# Patient Record
Sex: Female | Born: 1935 | Race: White | Hispanic: No
Health system: Southern US, Community
[De-identification: ages and names within clinical notes are randomized; demographics above are authoritative.]

---

## 2004-04-28 ENCOUNTER — Ambulatory Visit: Payer: Self-pay | Admitting: Family Medicine

## 2005-06-16 ENCOUNTER — Ambulatory Visit: Payer: Self-pay | Admitting: Internal Medicine

## 2005-08-11 ENCOUNTER — Ambulatory Visit: Payer: Self-pay | Admitting: Internal Medicine

## 2006-08-15 ENCOUNTER — Ambulatory Visit: Payer: Self-pay | Admitting: Internal Medicine

## 2006-08-21 ENCOUNTER — Ambulatory Visit: Payer: Self-pay

## 2006-08-23 ENCOUNTER — Ambulatory Visit: Payer: Self-pay | Admitting: Internal Medicine

## 2006-08-25 ENCOUNTER — Ambulatory Visit: Payer: Self-pay | Admitting: Internal Medicine

## 2006-11-05 ENCOUNTER — Emergency Department: Payer: Self-pay

## 2006-11-15 ENCOUNTER — Ambulatory Visit: Payer: Self-pay | Admitting: Gastroenterology

## 2007-01-09 ENCOUNTER — Ambulatory Visit: Payer: Self-pay | Admitting: Gastroenterology

## 2007-05-08 ENCOUNTER — Ambulatory Visit: Payer: Self-pay | Admitting: Specialist

## 2007-08-28 ENCOUNTER — Ambulatory Visit: Payer: Self-pay | Admitting: Internal Medicine

## 2007-11-15 ENCOUNTER — Ambulatory Visit: Payer: Self-pay | Admitting: Specialist

## 2009-08-05 ENCOUNTER — Ambulatory Visit: Payer: Self-pay | Admitting: Internal Medicine

## 2009-09-01 ENCOUNTER — Ambulatory Visit: Payer: Self-pay | Admitting: Internal Medicine

## 2009-09-01 ENCOUNTER — Ambulatory Visit: Payer: Self-pay | Admitting: Cardiology

## 2009-09-04 ENCOUNTER — Ambulatory Visit: Payer: Self-pay | Admitting: Cardiology

## 2011-02-10 ENCOUNTER — Inpatient Hospital Stay: Payer: Self-pay | Admitting: Internal Medicine

## 2011-12-05 IMAGING — US ABDOMEN ULTRASOUND
1 series · 17 of 25 positions shown · non-contrast
Comparison: none

REASON FOR EXAM: abdominal pain
COMMENTS:

[Series 1: abdomen ultrasound · 17 of 77 slices shown]
[im 1/77]
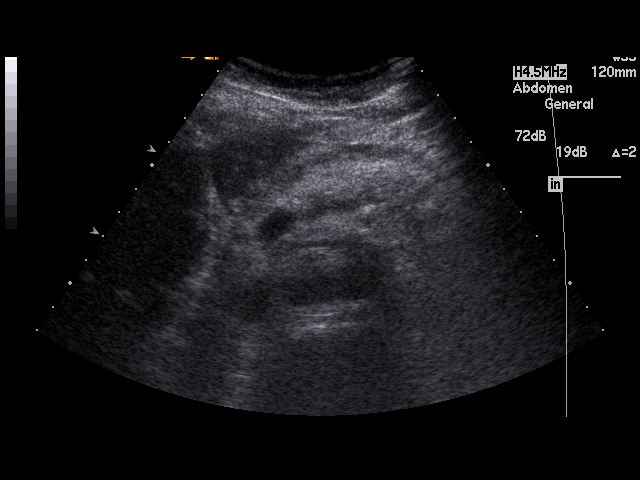
[im 7/77]
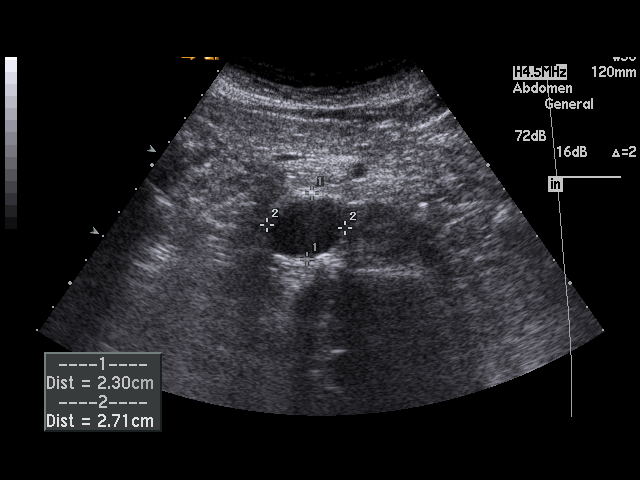
[im 10/77]
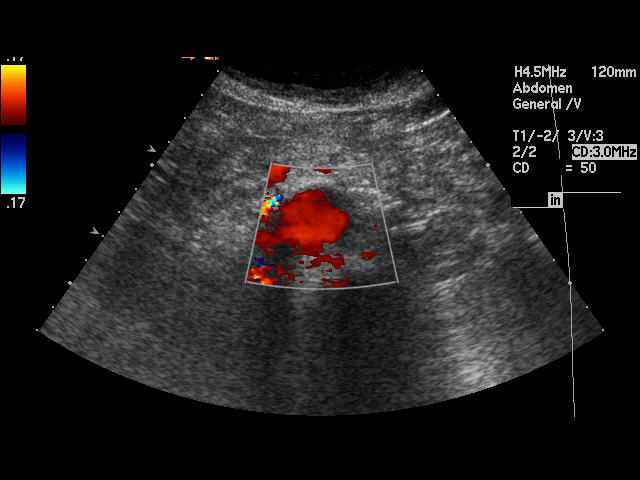
[im 16/77]
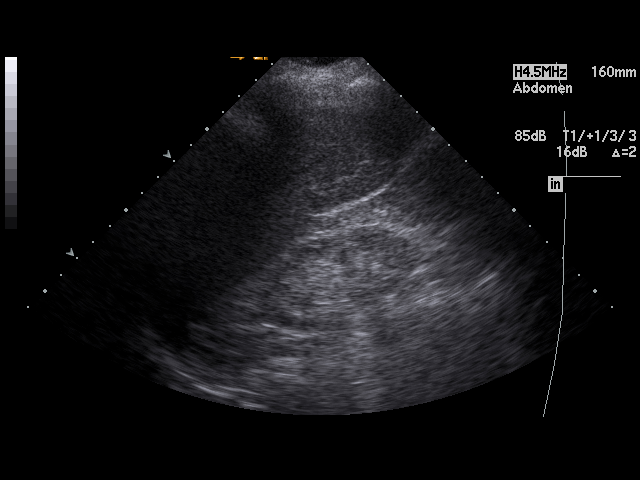
[im 20/77]
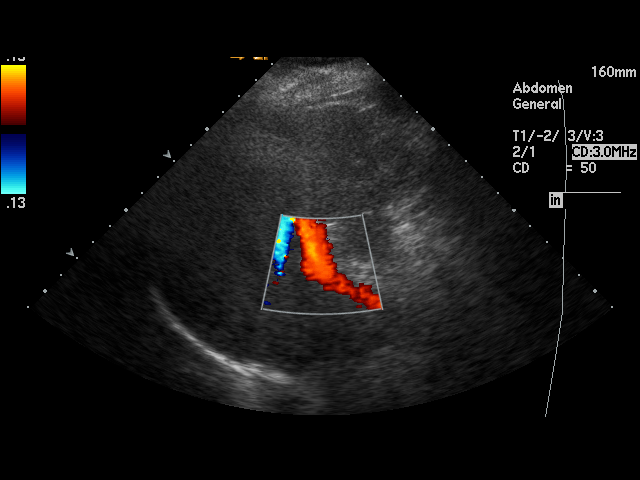
[im 26/77]
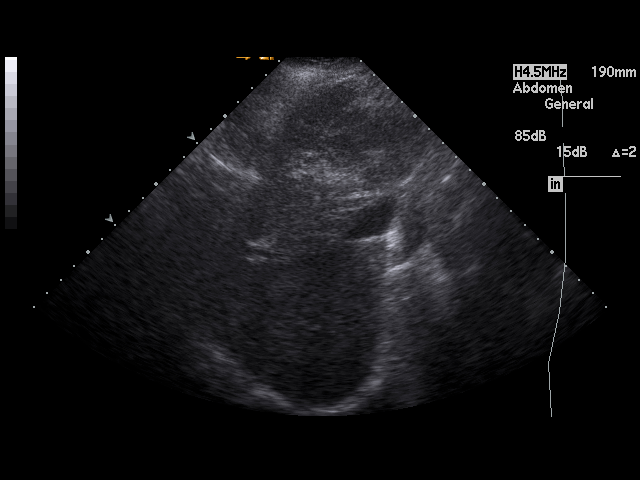
[im 29/77]
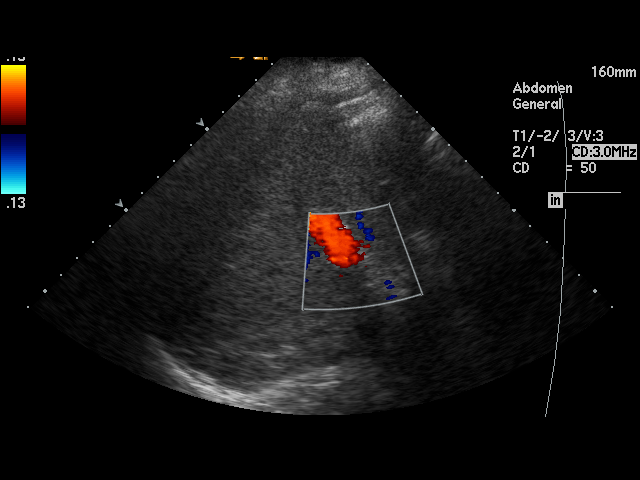
[im 35/77]
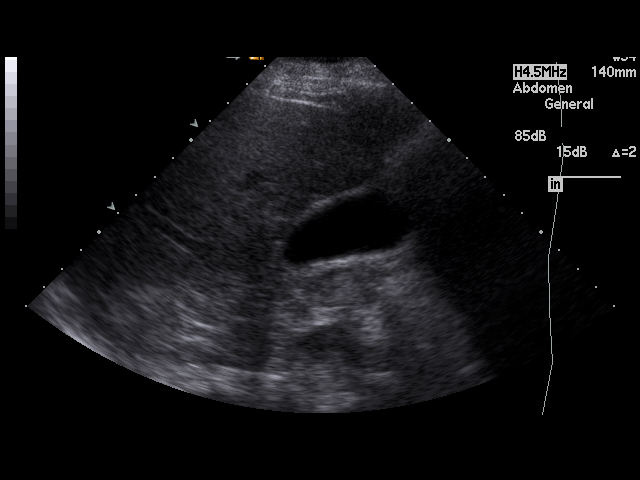
[im 39/77]
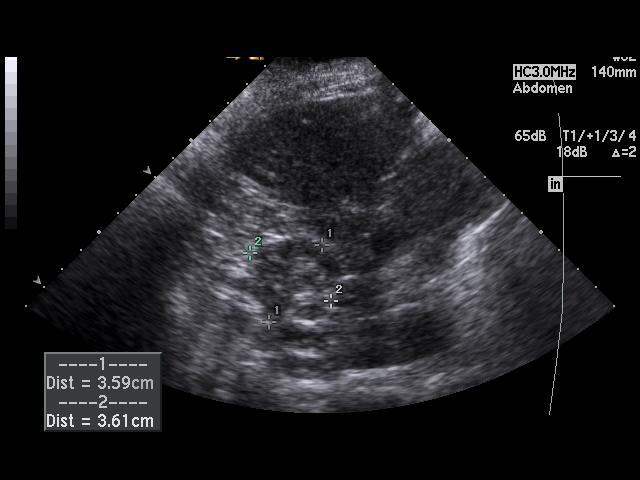
[im 42/77]
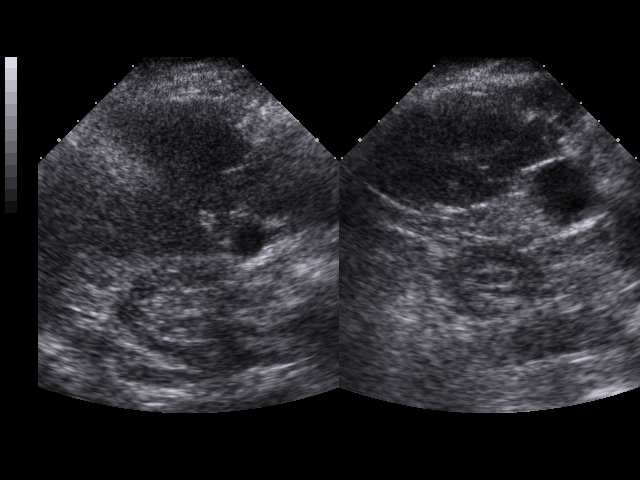
[im 48/77]
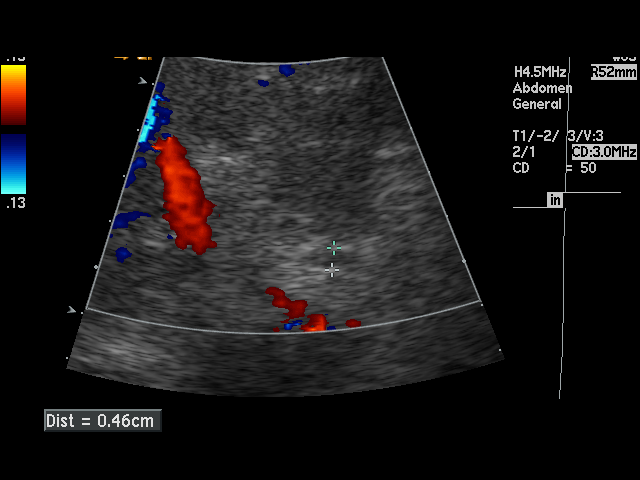
[im 51/77]
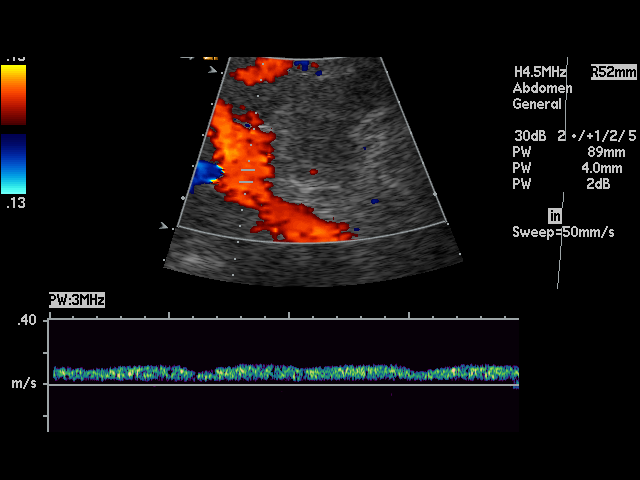
[im 58/77]
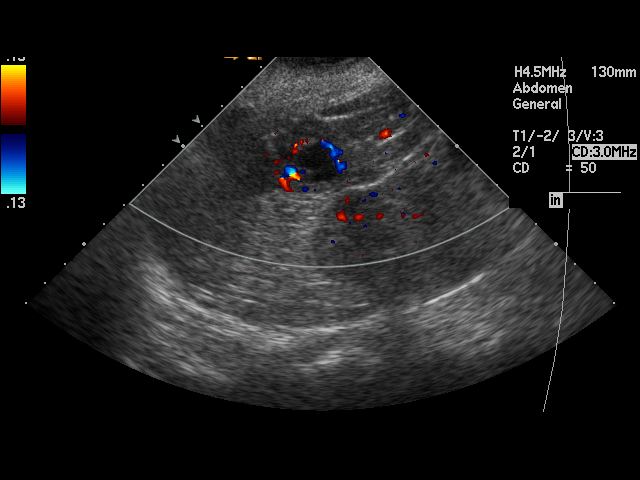
[im 61/77]
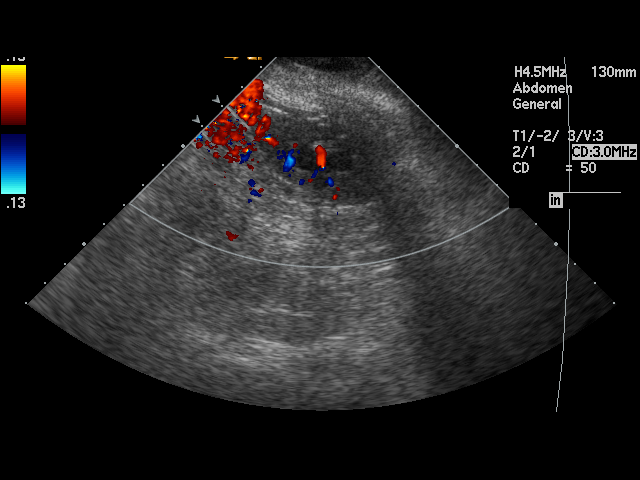
[im 67/77]
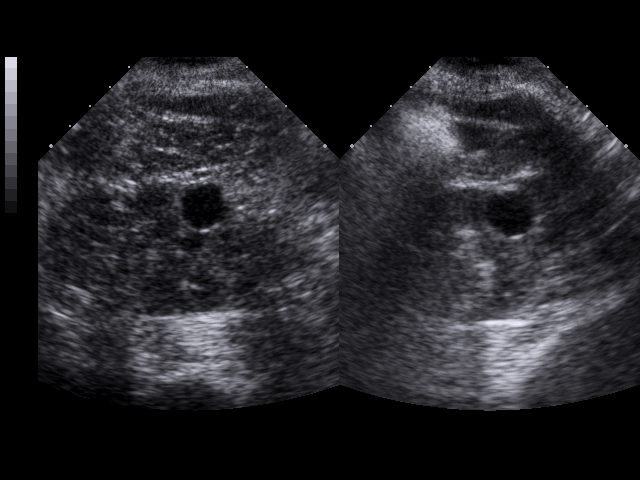
[im 70/77]
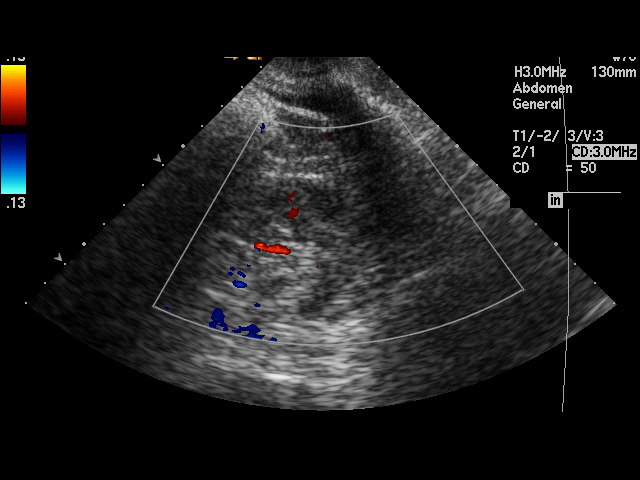
[im 77/77]
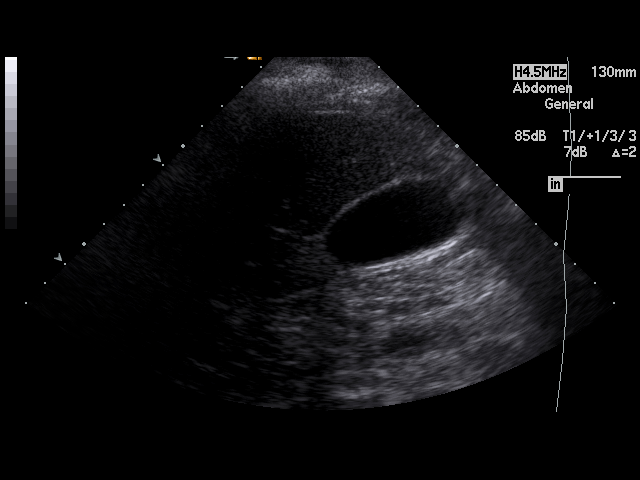

[17 of 25 positions shown; findings below may reference images not displayed]

PROCEDURE:     US  - US ABDOMEN GENERAL SURVEY  - August 05, 2009 [DATE]

RESULT:

The liver demonstrates a homogeneous echotexture. Hepatopetal flow is
identified within the portal vein. The visualized portion of the aorta and
IVC are unremarkable. The pancreas demonstrates a homogeneous echotexture.
Evaluation of the gallbladder demonstrates no evidence of pericholecystic
fluid, gallstones nor sludging. There is no evidence of a sonographic
Murphy's sign. Gallbladder wall thickness is 2.2 mm. The common bile duct
measures 4.6 mm in diameter. Evaluation of the kidneys demonstrates no
evidence of hydronephrosis, masses or calculi. There is cortical thinning
involving the kidneys. There is appropriate corticomedullary
differentiation. The right kidney measures 9.7 x 3.6 x 3.6 cm and the left
9.8 x 4.7 x 4.4 cm. A cyst is identified within the left kidney measuring
1.73 x 1.84 x 1.37 cm. Evaluation of the spleen demonstrates a partially
cystic appearing mass measuring 3.37 x 2.22 x 2.59 cm.
IMPRESSION: 1. Stable appearing cystic mass within the spleen.
2. No further sonographic abnormalities. A stable cyst is identified within
the left kidney.
3. Thinning of the right renal cortex which may represent a component of
medical renal disease.

## 2013-02-12 ENCOUNTER — Other Ambulatory Visit: Payer: Self-pay | Admitting: Family Medicine

## 2013-02-12 LAB — COMPREHENSIVE METABOLIC PANEL
Alkaline Phosphatase: 83 U/L (ref 50–136)
BUN: 19 mg/dL — ABNORMAL HIGH (ref 7–18)
Bilirubin,Total: 0.4 mg/dL (ref 0.2–1.0)
Calcium, Total: 8.9 mg/dL (ref 8.5–10.1)
Chloride: 105 mmol/L (ref 98–107)
Co2: 27 mmol/L (ref 21–32)
EGFR (African American): 58 — ABNORMAL LOW
Glucose: 188 mg/dL — ABNORMAL HIGH (ref 65–99)
SGOT(AST): 20 U/L (ref 15–37)
SGPT (ALT): 17 U/L (ref 12–78)
Total Protein: 6.3 g/dL — ABNORMAL LOW (ref 6.4–8.2)

## 2013-02-12 LAB — PROTIME-INR
INR: 1
Prothrombin Time: 13.3 secs (ref 11.5–14.7)

## 2013-02-12 LAB — CBC WITH DIFFERENTIAL/PLATELET
Basophil #: 0.2 10*3/uL — ABNORMAL HIGH (ref 0.0–0.1)
Basophil %: 1.8 %
Eosinophil #: 0.3 10*3/uL (ref 0.0–0.7)
Eosinophil %: 3.5 %
HCT: 37.3 % (ref 35.0–47.0)
HGB: 12.5 g/dL (ref 12.0–16.0)
MCH: 28.5 pg (ref 26.0–34.0)
MCHC: 33.5 g/dL (ref 32.0–36.0)
Monocyte #: 0.7 x10 3/mm (ref 0.2–0.9)
Neutrophil #: 5.5 10*3/uL (ref 1.4–6.5)
Neutrophil %: 60.2 %
Platelet: 250 10*3/uL (ref 150–440)
RBC: 4.38 10*6/uL (ref 3.80–5.20)

## 2013-02-12 LAB — URINALYSIS, COMPLETE
Bilirubin,UR: NEGATIVE
Blood: NEGATIVE
Glucose,UR: NEGATIVE mg/dL (ref 0–75)
Ketone: NEGATIVE
Nitrite: POSITIVE
Protein: NEGATIVE
Specific Gravity: 1.024 (ref 1.003–1.030)
WBC UR: 13 /HPF (ref 0–5)

## 2013-02-14 LAB — URINE CULTURE

## 2013-04-13 ENCOUNTER — Observation Stay: Payer: Self-pay | Admitting: Internal Medicine

## 2013-04-13 LAB — URINALYSIS, COMPLETE
BILIRUBIN, UR: NEGATIVE
BLOOD: NEGATIVE
GLUCOSE, UR: NEGATIVE mg/dL (ref 0–75)
Ketone: NEGATIVE
Leukocyte Esterase: NEGATIVE
Nitrite: NEGATIVE
Ph: 5 (ref 4.5–8.0)
Protein: NEGATIVE
RBC,UR: <1 /HPF (ref 0–5)
SPECIFIC GRAVITY: 1.009 (ref 1.003–1.030)
Squamous Epithelial: NONE SEEN
WBC UR: 3 /HPF (ref 0–5)

## 2013-04-13 LAB — CBC
HCT: 43.1 % (ref 35.0–47.0)
HGB: 14.3 g/dL (ref 12.0–16.0)
MCH: 27.7 pg (ref 26.0–34.0)
MCHC: 33.1 g/dL (ref 32.0–36.0)
MCV: 84 fL (ref 80–100)
PLATELETS: 255 10*3/uL (ref 150–440)
RBC: 5.16 10*6/uL (ref 3.80–5.20)
RDW: 14.8 % — AB (ref 11.5–14.5)
WBC: 9.2 10*3/uL (ref 3.6–11.0)

## 2013-04-13 LAB — COMPREHENSIVE METABOLIC PANEL
ALK PHOS: 62 U/L
Albumin: 3.1 g/dL — ABNORMAL LOW (ref 3.4–5.0)
Anion Gap: 3 — ABNORMAL LOW (ref 7–16)
BILIRUBIN TOTAL: 0.5 mg/dL (ref 0.2–1.0)
BUN: 18 mg/dL (ref 7–18)
CALCIUM: 9.3 mg/dL (ref 8.5–10.1)
CHLORIDE: 101 mmol/L (ref 98–107)
Co2: 31 mmol/L (ref 21–32)
Creatinine: 0.76 mg/dL (ref 0.60–1.30)
EGFR (African American): >60
EGFR (Non-African Amer.): >60
Glucose: 116 mg/dL — ABNORMAL HIGH (ref 65–99)
Osmolality: 273 (ref 275–301)
Potassium: 5 mmol/L (ref 3.5–5.1)
SGOT(AST): 42 U/L — ABNORMAL HIGH (ref 15–37)
SGPT (ALT): 25 U/L (ref 12–78)
Sodium: 135 mmol/L — ABNORMAL LOW (ref 136–145)
TOTAL PROTEIN: 7.2 g/dL (ref 6.4–8.2)

## 2013-04-13 LAB — CK-MB: CK-MB: 1.2 ng/mL (ref 0.5–3.6)

## 2013-04-13 LAB — TROPONIN I
Troponin-I: <0.02 ng/mL
Troponin-I: <0.02 ng/mL

## 2013-04-14 LAB — CBC WITH DIFFERENTIAL/PLATELET
Basophil #: 0 10*3/uL (ref 0.0–0.1)
Basophil %: 0.5 %
EOS PCT: 4.1 %
Eosinophil #: 0.4 10*3/uL (ref 0.0–0.7)
HCT: 40 % (ref 35.0–47.0)
HGB: 13.5 g/dL (ref 12.0–16.0)
LYMPHS ABS: 2.8 10*3/uL (ref 1.0–3.6)
Lymphocyte %: 32.5 %
MCH: 28.6 pg (ref 26.0–34.0)
MCHC: 33.8 g/dL (ref 32.0–36.0)
MCV: 85 fL (ref 80–100)
Monocyte #: 0.8 x10 3/mm (ref 0.2–0.9)
Monocyte %: 9.6 %
Neutrophil #: 4.5 10*3/uL (ref 1.4–6.5)
Neutrophil %: 53.3 %
Platelet: 231 10*3/uL (ref 150–440)
RBC: 4.73 10*6/uL (ref 3.80–5.20)
RDW: 14.9 % — AB (ref 11.5–14.5)
WBC: 8.5 10*3/uL (ref 3.6–11.0)

## 2013-04-14 LAB — BASIC METABOLIC PANEL
ANION GAP: 1 — AB (ref 7–16)
BUN: 21 mg/dL — AB (ref 7–18)
CREATININE: 0.97 mg/dL (ref 0.60–1.30)
Calcium, Total: 9 mg/dL (ref 8.5–10.1)
Chloride: 104 mmol/L (ref 98–107)
Co2: 32 mmol/L (ref 21–32)
EGFR (Non-African Amer.): 51 — ABNORMAL LOW
GFR CALC AF AMER: 59 — AB
Glucose: 120 mg/dL — ABNORMAL HIGH (ref 65–99)
Osmolality: 278 (ref 275–301)
Potassium: 3.8 mmol/L (ref 3.5–5.1)
SODIUM: 137 mmol/L (ref 136–145)

## 2013-04-14 LAB — CK-MB: CK-MB: 1 ng/mL (ref 0.5–3.6)

## 2013-04-14 LAB — TROPONIN I: Troponin-I: <0.02 ng/mL

## 2013-04-20 ENCOUNTER — Emergency Department: Payer: Self-pay | Admitting: Internal Medicine

## 2013-07-31 ENCOUNTER — Inpatient Hospital Stay: Payer: Self-pay | Admitting: Internal Medicine

## 2013-07-31 LAB — COMPREHENSIVE METABOLIC PANEL
ANION GAP: 7 (ref 7–16)
Albumin: 3 g/dL — ABNORMAL LOW (ref 3.4–5.0)
Alkaline Phosphatase: 73 U/L
BILIRUBIN TOTAL: 0.3 mg/dL (ref 0.2–1.0)
BUN: 16 mg/dL (ref 7–18)
CALCIUM: 9.2 mg/dL (ref 8.5–10.1)
CHLORIDE: 105 mmol/L (ref 98–107)
CREATININE: 0.84 mg/dL (ref 0.60–1.30)
Co2: 27 mmol/L (ref 21–32)
EGFR (Non-African Amer.): >60
Glucose: 127 mg/dL — ABNORMAL HIGH (ref 65–99)
Osmolality: 280 (ref 275–301)
Potassium: 4.1 mmol/L (ref 3.5–5.1)
SGOT(AST): 24 U/L (ref 15–37)
SGPT (ALT): 21 U/L (ref 12–78)
Sodium: 139 mmol/L (ref 136–145)
Total Protein: 7.2 g/dL (ref 6.4–8.2)

## 2013-07-31 LAB — URINALYSIS, COMPLETE
BACTERIA: NONE SEEN
BILIRUBIN, UR: NEGATIVE
Blood: NEGATIVE
Glucose,UR: NEGATIVE mg/dL (ref 0–75)
Ketone: NEGATIVE
Nitrite: NEGATIVE
Ph: 5 (ref 4.5–8.0)
Protein: NEGATIVE
Specific Gravity: 1.023 (ref 1.003–1.030)
WBC UR: 7 /HPF (ref 0–5)

## 2013-07-31 LAB — APTT: ACTIVATED PTT: 29.3 s (ref 23.6–35.9)

## 2013-07-31 LAB — CBC WITH DIFFERENTIAL/PLATELET
Basophil #: 0 10*3/uL (ref 0.0–0.1)
Basophil %: 0.4 %
EOS ABS: 0.6 10*3/uL (ref 0.0–0.7)
Eosinophil %: 5.4 %
HCT: 43.4 % (ref 35.0–47.0)
HGB: 13.6 g/dL (ref 12.0–16.0)
Lymphocyte #: 3.9 10*3/uL — ABNORMAL HIGH (ref 1.0–3.6)
Lymphocyte %: 35.2 %
MCH: 28 pg (ref 26.0–34.0)
MCHC: 31.4 g/dL — ABNORMAL LOW (ref 32.0–36.0)
MCV: 89 fL (ref 80–100)
Monocyte #: 0.9 x10 3/mm (ref 0.2–0.9)
Monocyte %: 7.9 %
Neutrophil #: 5.7 10*3/uL (ref 1.4–6.5)
Neutrophil %: 51.1 %
Platelet: 253 10*3/uL (ref 150–440)
RBC: 4.85 10*6/uL (ref 3.80–5.20)
RDW: 15.3 % — AB (ref 11.5–14.5)
WBC: 11.2 10*3/uL — AB (ref 3.6–11.0)

## 2013-07-31 LAB — TROPONIN I: Troponin-I: <0.02 ng/mL

## 2013-07-31 LAB — PROTIME-INR
INR: 1.1
Prothrombin Time: 13.8 secs (ref 11.5–14.7)

## 2013-08-01 LAB — CBC WITH DIFFERENTIAL/PLATELET
Basophil #: 0.2 10*3/uL — ABNORMAL HIGH (ref 0.0–0.1)
Basophil %: 2.6 %
EOS PCT: 7.5 %
Eosinophil #: 0.6 10*3/uL (ref 0.0–0.7)
HCT: 38.7 % (ref 35.0–47.0)
HGB: 12.9 g/dL (ref 12.0–16.0)
LYMPHS ABS: 3.2 10*3/uL (ref 1.0–3.6)
Lymphocyte %: 37.6 %
MCH: 29.5 pg (ref 26.0–34.0)
MCHC: 33.2 g/dL (ref 32.0–36.0)
MCV: 89 fL (ref 80–100)
MONO ABS: 0.7 x10 3/mm (ref 0.2–0.9)
MONOS PCT: 8.8 %
Neutrophil #: 3.7 10*3/uL (ref 1.4–6.5)
Neutrophil %: 43.5 %
Platelet: 247 10*3/uL (ref 150–440)
RBC: 4.36 10*6/uL (ref 3.80–5.20)
RDW: 15.3 % — ABNORMAL HIGH (ref 11.5–14.5)
WBC: 8.5 10*3/uL (ref 3.6–11.0)

## 2013-08-01 LAB — BASIC METABOLIC PANEL
ANION GAP: 6 — AB (ref 7–16)
BUN: 15 mg/dL (ref 7–18)
CHLORIDE: 108 mmol/L — AB (ref 98–107)
Calcium, Total: 8.5 mg/dL (ref 8.5–10.1)
Co2: 28 mmol/L (ref 21–32)
Creatinine: 0.63 mg/dL (ref 0.60–1.30)
EGFR (African American): >60
Glucose: 97 mg/dL (ref 65–99)
OSMOLALITY: 284 (ref 275–301)
Potassium: 4 mmol/L (ref 3.5–5.1)
SODIUM: 142 mmol/L (ref 136–145)

## 2013-08-01 LAB — LIPID PANEL
Cholesterol: 215 mg/dL — ABNORMAL HIGH (ref 0–200)
HDL Cholesterol: 31 mg/dL — ABNORMAL LOW (ref 40–60)
LDL CHOLESTEROL, CALC: 160 mg/dL — AB (ref 0–100)
Triglycerides: 118 mg/dL (ref 0–200)
VLDL Cholesterol, Calc: 24 mg/dL (ref 5–40)

## 2013-09-02 ENCOUNTER — Ambulatory Visit: Payer: Self-pay | Admitting: Internal Medicine

## 2013-09-09 ENCOUNTER — Inpatient Hospital Stay: Payer: Self-pay | Admitting: Internal Medicine

## 2013-09-09 LAB — URINALYSIS, COMPLETE
Bilirubin,UR: NEGATIVE
Glucose,UR: NEGATIVE mg/dL (ref 0–75)
Ketone: NEGATIVE
Nitrite: NEGATIVE
PH: 5 (ref 4.5–8.0)
Protein: 100
Specific Gravity: 1.014 (ref 1.003–1.030)
Squamous Epithelial: NONE SEEN

## 2013-09-09 LAB — PHOSPHORUS: Phosphorus: 2 mg/dL — ABNORMAL LOW (ref 2.5–4.9)

## 2013-09-09 LAB — CBC WITH DIFFERENTIAL/PLATELET
BASOS ABS: 0.1 10*3/uL (ref 0.0–0.1)
Basophil #: 0 10*3/uL (ref 0.0–0.1)
Basophil %: 0.4 %
Basophil %: 0.1 %
EOS PCT: 0.1 %
Eosinophil #: 0 10*3/uL (ref 0.0–0.7)
Eosinophil #: 0 10*3/uL (ref 0.0–0.7)
Eosinophil %: 0 %
HCT: 42.4 % (ref 35.0–47.0)
HCT: 37.9 % (ref 35.0–47.0)
HGB: 13.6 g/dL (ref 12.0–16.0)
HGB: 11.9 g/dL — AB (ref 12.0–16.0)
LYMPHS ABS: 0.5 10*3/uL — AB (ref 1.0–3.6)
Lymphocyte #: 1.1 10*3/uL (ref 1.0–3.6)
Lymphocyte %: 2 %
Lymphocyte %: 2.8 %
MCH: 27.8 pg (ref 26.0–34.0)
MCH: 27.4 pg (ref 26.0–34.0)
MCHC: 32 g/dL (ref 32.0–36.0)
MCHC: 31.4 g/dL — AB (ref 32.0–36.0)
MCV: 87 fL (ref 80–100)
MCV: 87 fL (ref 80–100)
MONO ABS: 0.1 x10 3/mm — AB (ref 0.2–0.9)
MONOS PCT: 2.9 %
Monocyte #: 1.1 x10 3/mm — ABNORMAL HIGH (ref 0.2–0.9)
Monocyte %: 0.2 %
NEUTROS ABS: 35.1 10*3/uL — AB (ref 1.4–6.5)
NEUTROS PCT: 97.3 %
Neutrophil #: 24.4 10*3/uL — ABNORMAL HIGH (ref 1.4–6.5)
Neutrophil %: 94.2 %
PLATELETS: 213 10*3/uL (ref 150–440)
Platelet: 255 10*3/uL (ref 150–440)
RBC: 4.89 10*6/uL (ref 3.80–5.20)
RBC: 4.34 10*6/uL (ref 3.80–5.20)
RDW: 14.3 % (ref 11.5–14.5)
RDW: 14.5 % (ref 11.5–14.5)
WBC: 25.1 10*3/uL — AB (ref 3.6–11.0)
WBC: 37.3 10*3/uL — AB (ref 3.6–11.0)

## 2013-09-09 LAB — PROTIME-INR
INR: 1.2
Prothrombin Time: 15.4 secs — ABNORMAL HIGH (ref 11.5–14.7)

## 2013-09-09 LAB — COMPREHENSIVE METABOLIC PANEL
ALK PHOS: 69 U/L
ANION GAP: 11 (ref 7–16)
AST: 43 U/L — AB (ref 15–37)
Albumin: 2.5 g/dL — ABNORMAL LOW (ref 3.4–5.0)
BILIRUBIN TOTAL: 0.6 mg/dL (ref 0.2–1.0)
BUN: 19 mg/dL — ABNORMAL HIGH (ref 7–18)
CO2: 22 mmol/L (ref 21–32)
Calcium, Total: 8.9 mg/dL (ref 8.5–10.1)
Chloride: 107 mmol/L (ref 98–107)
Creatinine: 1.6 mg/dL — ABNORMAL HIGH (ref 0.60–1.30)
EGFR (African American): 32 — ABNORMAL LOW
EGFR (Non-African Amer.): 28 — ABNORMAL LOW
GLUCOSE: 152 mg/dL — AB (ref 65–99)
OSMOLALITY: 285 (ref 275–301)
POTASSIUM: 3.7 mmol/L (ref 3.5–5.1)
SGPT (ALT): 24 U/L (ref 12–78)
Sodium: 140 mmol/L (ref 136–145)
Total Protein: 6.3 g/dL — ABNORMAL LOW (ref 6.4–8.2)

## 2013-09-09 LAB — APTT: Activated PTT: 133.4 secs — ABNORMAL HIGH (ref 23.6–35.9)

## 2013-09-09 LAB — TROPONIN I
Troponin-I: 0.17 ng/mL — ABNORMAL HIGH
Troponin-I: 0.38 ng/mL — ABNORMAL HIGH

## 2013-09-09 LAB — CK-MB
CK-MB: 0.8 ng/mL (ref 0.5–3.6)
CK-MB: 2.2 ng/mL (ref 0.5–3.6)

## 2013-09-09 LAB — MAGNESIUM: Magnesium: 1.5 mg/dL — ABNORMAL LOW

## 2013-09-10 LAB — BASIC METABOLIC PANEL
Anion Gap: 9 (ref 7–16)
BUN: 21 mg/dL — AB (ref 7–18)
CALCIUM: 7.7 mg/dL — AB (ref 8.5–10.1)
CREATININE: 1.27 mg/dL (ref 0.60–1.30)
Chloride: 109 mmol/L — ABNORMAL HIGH (ref 98–107)
Co2: 21 mmol/L (ref 21–32)
GFR CALC AF AMER: 42 — AB
GFR CALC NON AF AMER: 37 — AB
Glucose: 119 mg/dL — ABNORMAL HIGH (ref 65–99)
Osmolality: 282 (ref 275–301)
POTASSIUM: 4.1 mmol/L (ref 3.5–5.1)
SODIUM: 139 mmol/L (ref 136–145)

## 2013-09-10 LAB — CBC WITH DIFFERENTIAL/PLATELET
Basophil #: 0.4 10*3/uL — ABNORMAL HIGH (ref 0.0–0.1)
Basophil %: 1.1 %
EOS ABS: 0 10*3/uL (ref 0.0–0.7)
EOS PCT: 0 %
HCT: 36.2 % (ref 35.0–47.0)
HGB: 11.6 g/dL — AB (ref 12.0–16.0)
LYMPHS PCT: 3.2 %
Lymphocyte #: 1.2 10*3/uL (ref 1.0–3.6)
MCH: 27.6 pg (ref 26.0–34.0)
MCHC: 31.9 g/dL — ABNORMAL LOW (ref 32.0–36.0)
MCV: 86 fL (ref 80–100)
Monocyte #: 1.1 x10 3/mm — ABNORMAL HIGH (ref 0.2–0.9)
Monocyte %: 2.8 %
NEUTROS PCT: 92.9 %
Neutrophil #: 34.9 10*3/uL — ABNORMAL HIGH (ref 1.4–6.5)
Platelet: 206 10*3/uL (ref 150–440)
RBC: 4.19 10*6/uL (ref 3.80–5.20)
RDW: 14.1 % (ref 11.5–14.5)
WBC: 37.5 10*3/uL — AB (ref 3.6–11.0)

## 2013-09-10 LAB — APTT

## 2013-09-10 LAB — MAGNESIUM: MAGNESIUM: 2.1 mg/dL

## 2013-09-10 LAB — TROPONIN I: TROPONIN-I: 0.37 ng/mL — AB

## 2013-09-10 LAB — CK-MB: CK-MB: 1.7 ng/mL (ref 0.5–3.6)

## 2013-09-11 LAB — URINE CULTURE

## 2013-09-11 LAB — PLATELET COUNT: PLATELETS: 171 10*3/uL (ref 150–440)

## 2013-09-11 LAB — CULTURE, BLOOD (SINGLE)

## 2013-09-13 LAB — BASIC METABOLIC PANEL
Anion Gap: 10 (ref 7–16)
BUN: 5 mg/dL — ABNORMAL LOW (ref 7–18)
CO2: 21 mmol/L (ref 21–32)
Calcium, Total: 7.5 mg/dL — ABNORMAL LOW (ref 8.5–10.1)
Chloride: 115 mmol/L — ABNORMAL HIGH (ref 98–107)
Creatinine: 0.52 mg/dL — ABNORMAL LOW (ref 0.60–1.30)
EGFR (African American): >60
Glucose: 112 mg/dL — ABNORMAL HIGH (ref 65–99)
OSMOLALITY: 289 (ref 275–301)
POTASSIUM: 3 mmol/L — AB (ref 3.5–5.1)
Sodium: 146 mmol/L — ABNORMAL HIGH (ref 136–145)

## 2013-09-13 LAB — CBC WITH DIFFERENTIAL/PLATELET
BASOS PCT: 0.6 %
Basophil #: 0.1 10*3/uL (ref 0.0–0.1)
Eosinophil #: 0.9 10*3/uL — ABNORMAL HIGH (ref 0.0–0.7)
Eosinophil %: 6.7 %
HCT: 37 % (ref 35.0–47.0)
HGB: 12 g/dL (ref 12.0–16.0)
LYMPHS ABS: 1.3 10*3/uL (ref 1.0–3.6)
LYMPHS PCT: 9 %
MCH: 27.6 pg (ref 26.0–34.0)
MCHC: 32.5 g/dL (ref 32.0–36.0)
MCV: 85 fL (ref 80–100)
MONOS PCT: 4.7 %
Monocyte #: 0.7 x10 3/mm (ref 0.2–0.9)
NEUTROS ABS: 11.2 10*3/uL — AB (ref 1.4–6.5)
Neutrophil %: 79 %
Platelet: 180 10*3/uL (ref 150–440)
RBC: 4.35 10*6/uL (ref 3.80–5.20)
RDW: 14.9 % — ABNORMAL HIGH (ref 11.5–14.5)
WBC: 14.2 10*3/uL — AB (ref 3.6–11.0)

## 2013-09-14 LAB — CBC WITH DIFFERENTIAL/PLATELET
BASOS PCT: 2.2 %
Basophil #: 0.2 10*3/uL — ABNORMAL HIGH (ref 0.0–0.1)
Eosinophil #: 0.6 10*3/uL (ref 0.0–0.7)
Eosinophil %: 6.5 %
HCT: 33.3 % — ABNORMAL LOW (ref 35.0–47.0)
HGB: 11.5 g/dL — AB (ref 12.0–16.0)
LYMPHS ABS: 2.5 10*3/uL (ref 1.0–3.6)
LYMPHS PCT: 27.5 %
MCH: 28.8 pg (ref 26.0–34.0)
MCHC: 34.5 g/dL (ref 32.0–36.0)
MCV: 84 fL (ref 80–100)
MONO ABS: 0.9 x10 3/mm (ref 0.2–0.9)
Monocyte %: 9.8 %
Neutrophil #: 4.9 10*3/uL (ref 1.4–6.5)
Neutrophil %: 54 %
PLATELETS: 185 10*3/uL (ref 150–440)
RBC: 3.99 10*6/uL (ref 3.80–5.20)
RDW: 14.6 % — ABNORMAL HIGH (ref 11.5–14.5)
WBC: 9.1 10*3/uL (ref 3.6–11.0)

## 2013-09-14 LAB — BASIC METABOLIC PANEL
Anion Gap: 8 (ref 7–16)
BUN: 5 mg/dL — ABNORMAL LOW (ref 7–18)
Calcium, Total: 7.9 mg/dL — ABNORMAL LOW (ref 8.5–10.1)
Chloride: 114 mmol/L — ABNORMAL HIGH (ref 98–107)
Co2: 22 mmol/L (ref 21–32)
Creatinine: 0.56 mg/dL — ABNORMAL LOW (ref 0.60–1.30)
EGFR (African American): >60
EGFR (Non-African Amer.): >60
GLUCOSE: 88 mg/dL (ref 65–99)
Osmolality: 284 (ref 275–301)
Potassium: 3.4 mmol/L — ABNORMAL LOW (ref 3.5–5.1)
Sodium: 144 mmol/L (ref 136–145)

## 2013-10-02 ENCOUNTER — Ambulatory Visit: Payer: Self-pay | Admitting: Internal Medicine

## 2013-11-02 DEATH — deceased

## 2014-07-26 NOTE — H&P (Signed)
PATIENT NAME:  Virginia Adams, Virginia Adams MR#:  [id number] DATE OF BIRTH:  10-21-1919  DATE OF ADMISSION:  07/31/2013  PRIMARY CARE PHYSICIAN: Dr. Dorothey Basemanavid Bronstein  REFERRING EMERGENCY ROOM PHYSICIAN:  Dr. Loleta Roseory Forbach  CHIEF COMPLAINT: Weakness.   HISTORY OF PRESENT ILLNESS: Currently, patient has severe dementia, so history obtained from her daughter who is healthcare power of attorney and present in the room.  A 81 year old female with past medical history of coronary artery disease and bypass surgery status post pacemaker placement, history of cerebrovascular accident, hypertension, severe dementia, and osteoarthritis, lives at home with her daughter, who is almost bedbound or wheelchair-bound for the last 5-6 months and daughter has 24/7 care at home to help take care of the patient. Yesterday, they noticed the patient is not moving her right arm. They thought it might go away, and today, they noticed she also stopped moving her right leg.  While her son was trying to feed her, he also noticed that she has some drooling from her side of the mouth and so, finally, they decided to bring her in to the Emergency Room. In initial workup in ER, she did not have any acute finding on CT scan, but she is still not able to move her right upper limb totally. She started moving her right lower limb a little bit but because of these findings. She is being admitted as acute cerebrovascular accident.   REVIEW OF SYSTEMS:  Not able to get it as patient is severely demented.   PAST MEDICAL HISTORY:  1. Coronary artery disease, status post bypass graft surgery.  2. Mitral valve disease.  3. History of cerebrovascular accident.  4. Nephrolithiasis.  5. Osteoporosis.  6. Hypertension.  7. Severe dementia.  8. Reactive airway disease.  9. Osteoarthritis.  10. Gastritis.  11. Chronic kidney disease.   PAST SURGICAL HISTORY:  1. Pacemaker placement.  2. Coronary artery bypass graft surgery.  3. Right hip replacement  surgery.  4. Bilateral cataract surgery.   SOCIAL HISTORY: [facility] almost [facility]-8 months in the past, but then her daughter took her home with 24/7 home care since last admission in January. No smoking or alcohol history. She is currently totally bedbound  or wheelchair-bound and full care.   FAMILY HISTORY: Positive for diabetes.  It runs in the family.   MEDICATIONS AT HOME:  1. Vitamin D2 at 50,000 international units once a month.  2. Tylenol 325 mg oral tablet 2 tablets every 4 hours as needed for pain.  3. Senokot 2 tablets once a day as needed for constipation.  4. Omeprazole 20 mg oral capsule once a day.  5. Multivitamin 1 tablet oral daily.  6. Metoprolol 25 mg oral tablet 2 times a day.  7. Levocetirizine 5 mg oral tablet once a day.  8. Donepezil 10 mg oral once a day.  9. Calcium 600+ vitamin D 200 oral tablet 2 times a day.  10. Aspirin enteric-coated 81 mg once a day.  11. Advair 1 puff inhalation 2 times a day.   VITAL SIGNS: In ER, temperature 97.7, pulse 77, respirations 20, blood pressure 221/99, and pulse oximetry 97 on room air.  PHYSICAL EXAMINATION:  GENERAL: The patient is alert but not oriented. She has severe dementia. She follows some commands.  HEENT: Head and neck atraumatic. Conjunctivae pink. Oral mucosa moist.  NECK: Supple. No JVD.  RESPIRATORY: Bilateral clear and equal air entry.  CARDIOVASCULAR: S1, S2 present, pacemaker in place. Regular  rhythm. Murmur present.   ABDOMEN: Soft, nontender. Bowel sounds present. No organomegaly.  SKIN: No rashes. Legs: No edema.  NEUROLOGICAL: Power 3/5 in left upper limb and both lower limbs but right upper limb power 0/5. No facial weakness appreciated. No tremor or rigidity.  PSYCHIATRY:  Not able to assess as patient has severe terminal dementia.  IMPORTANT LABORATORY AND RADIOLOGY RESULTS: Chest x-ray: No acute cardiopulmonary findings. CT head without contrast: Diffuse cortical atrophy,  chronic ischemic white matter disease. No acute intracranial abnormality seen. Glucose 127, BUN 16, creatinine 0.84, sodium 139, potassium 4.1, chloride 105, and CO2 of 27. Calcium is 9.2.   Total protein 7.2, albumin 3.0, bilirubin 0.3, alkaline phosphatase 73, SGOT 24, and SGPT 21. Troponin less than 0.02. WBC 11.2, hemoglobin 13.6, platelet count is 253, MCV is 89. Prothrombin time is 13.8. INR is 1.1.   ASSESSMENT AND PLAN: A 81 year old female who is having history of coronary artery disease, bypass surgery and pacemaker placement, history of cerebrovascular accident, terminal dementia, hypertension, came with right-sided weakness.  1. Acute cerebrovascular accident.  Though CAT scan is negative, patient is still not able to move right upper extremity so that might be a lacunar infarction which is not visible on CAT scan. Unfortunately, because of pacemaker we are not able to get an MRI, but we will have to assume as cerebrovascular accident and will treat as it.  She will be on aspirin and Plavix. She is already bedbound and is full care so not much to add, but we will get speech and swallow evaluation to guide [place] about her diet consistency.  The patient is DNR and discussed with daughter and she confirms this.  Now with this new stroke, there might be further deterioration in her health, so we might need to consider palliative care if patient has severe dysphagia or not able to eat.  2. Coronary artery disease, status post coronary artery bypass grafting, and pacemaker. Continue aspirin, beta blocker, and metoprolol.  3. Hypertension. Currently blood pressure is high because of permissive hypertension. After stroke, it is more than 200 so we would like to give metoprolol, what she is taking at home but will not bring it too much down.  4. Severe dementia. This is chronic. No acute changes in the plan.  5. Elevated white cell count. Still need to be collected, urinalysis by ER, so will need to be  followed by rounding doctor tomorrow.  6. For cerebrovascular accident, as patient is totally bedbound and 81 year old with DNR wishes with terminal dementia, I do not been doing carotid Doppler or echocardiogram will change anything in her plan and so I would like to focus on more now outcome issue than finding out the reason for that and would like to arrange for more supportive services if she needs and to do speech and swallow evaluation to guide [place] for her diet. 7. Cold right upper limb. Her right upper limb is cold, colder than left. Doppler study at bedside is done to check the pulsation by Dr. Loleta Rose, emergency physician. As per him, the pulses on the right side are slightly weaker than the left, but with this acute stroke, there may not be many choices available for atherosclerosis or other findings even if we find for this decreased circulation.  If still that is a concern or gets worse, then maybe we might need to consider getting a CT scan with contrast or a Doppler study of the limb to evaluate further about circulation  issues. Currently, pulsations are palpable on right radial artery.   CODE STATUS:  DNR. Discussed with patient's son and daughter who are present in the room.   TOTAL TIME SPENT ON THIS ADMISSION: 50 minutes.    ____________________________ Hope Pigeon Elisabeth Pigeon, MD [id number] D: 07/31/2013 19:25:00 ET T: 07/31/2013 19:54:31 ET JOB#: [id number]  cc: Hope Pigeon. Elisabeth Pigeon, MD, <Dictator> Teena Irani. Terance Hart, MD Altamese Dilling MD ELECTRONICALLY SIGNED 08/12/2013 22:17

## 2014-07-26 NOTE — Discharge Summary (Signed)
Dates of Admission and Diagnosis:  Date of Admission 31-Jul-2013   Date of Discharge 02-Aug-2013   Admitting Diagnosis acute CVA   Final Diagnosis New onset right side weakness- likely a new stroke- repeat CT head negative- can not get MRI due to cardiac pace maker. Functional debility- total bed bound and care for last few months. CAD Htn hyperlipidemia    Chief Complaint/History of Present Illness patient has severe dementia, so history obtained from her daughter who is healthcare power of attorney and present in the room.  A 81 year old female with past medical history of coronary artery disease and bypass surgery status post pacemaker placement, history of cerebrovascular accident, hypertension, severe dementia, and osteoarthritis, lives at home with her daughter, who is almost bedbound or wheelchair-bound for the last 5-6 months and daughter has 24/7 care at home to help take care of the patient. Yesterday, they noticed the patient is not moving her right arm. They thought it might go away, and today, they noticed she also stopped moving her right leg.  While her son was trying to feed her, he also noticed that she has some drooling from her side of the mouth and so, finally, they decided to bring her in to the Emergency Room. In initial workup in ER, she did not have any acute finding on CT scan, but she is still not able to move her right upper limb totally. She started moving her right lower limb a little bit but because of these findings. She is being admitted as acute cerebrovascular accident.   Allergies:  NKDA: None  Penicillin: Unknown  Hepatic:  29-Apr-15 17:02   Bilirubin, Total 0.3  Alkaline Phosphatase 73 (45-117 NOTE: New Reference Range 02/22/13)  SGPT (ALT) 21  SGOT (AST) 24  Total Protein, Serum 7.2  Albumin, Serum  3.0  Routine Chem:  29-Apr-15 17:02   Glucose, Serum  127  BUN 16  Creatinine (comp) 0.84  Sodium, Serum 139  Potassium, Serum 4.1  Chloride,  Serum 105  CO2, Serum 27  Calcium (Total), Serum 9.2  Anion Gap 7  Osmolality (calc) 280  eGFR (African American) >60  eGFR (Non-African American) >60 (eGFR values <95m/min/1.73 m2 may be an indication of chronic kidney disease (CKD). Calculated eGFR is useful in patients with stable renal function. The eGFR calculation will not be reliable in acutely ill patients when serum creatinine is changing rapidly. It is not useful in  patients on dialysis. The eGFR calculation may not be applicable to patients at the low and high extremes of body sizes, pregnant women, and vegetarians.)  Result Comment CBC - SPECIMEN NOT LABELED/REORDERED  Result(s) reported on 31 Jul 2013 at 05:17PM.  Cardiac:  29-Apr-15 17:02   Troponin I < 0.02 (0.00-0.05 0.05 ng/mL or less: NEGATIVE  Repeat testing in 3-6 hrs  if clinically indicated. >0.05 ng/mL: POTENTIAL  MYOCARDIAL INJURY. Repeat  testing in 3-6 hrs if  clinically indicated. NOTE: An increase or decrease  of 30% or more on serial  testing suggests a  clinically important change)  Routine Coag:  29-Apr-15 17:02   Prothrombin 13.8  INR 1.1 (INR reference interval applies to patients on anticoagulant therapy. A single INR therapeutic range for coumarins is not optimal for all indications; however, the suggested range for most indications is 2.0 - 3.0. Exceptions to the INR Reference Range may include: Prosthetic heart valves, acute myocardial infarction, prevention of myocardial infarction, and combinations of aspirin and anticoagulant. The need for a higher or lower target  INR must be assessed individually. Reference: The Pharmacology and Management of the Vitamin K  antagonists: the seventh ACCP Conference on Antithrombotic and Thrombolytic Therapy. TDDUK.0254 Sept:126 (3suppl): [id number]. A HCT value >55% may artifactually increase the PT.  In one study,  the increase was an average of 25%. Reference:  "Effect on Routine and Special  Coagulation Testing Values of Citrate Anticoagulant Adjustment in Patients with High HCT Values." American Journal of Clinical Pathology 2006;126:400-405.)  Activated PTT (APTT) 29.3 (A HCT value >55% may artifactually increase the APTT. In one study, the increase was an average of 19%. Reference: "Effect on Routine and Special Coagulation Testing Values of Citrate Anticoagulant Adjustment in Patients with High HCT Values." American Journal of Clinical Pathology 2006;126:400-405.)  Routine Hem:  29-Apr-15 17:02   WBC (CBC) -  RBC (CBC) -  Hemoglobin (CBC) -  Hematocrit (CBC) -  Platelet Count (CBC) -  MCV -  MCH -  MCHC -  RDW -  Neutrophil % -  Lymphocyte % -  Monocyte % -  Eosinophil % -  Basophil % -  Neutrophil # -  Lymphocyte # -  Monocyte # -  Eosinophil # -  Basophil # -  Bands -  Segmented Neutrophils -  Lymphocytes -  Variant Lymphocytes -  Monocytes -  Eosinophil -  Basophil -  Metamyelocyte -  Myelocyte -  Promyelocyte -  Blast-Like -  Other Cells -  NRBC -  Diff Comment 1 -  Diff Comment 2 -  Diff Comment 3 -  Diff Comment 4 -  Diff Comment 5 -  Diff Comment 6 -  Diff Comment 7 -  Diff Comment 8 -  Diff Comment 9 -  Diff Comment 10 - (Result(s) reported on 31 Jul 2013 at 05:17PM.)   PERTINENT RADIOLOGY STUDIES: LabUnknown:    29-Apr-15 17:58, CT Head Without Contrast  PACS Image   CT:  CT Head Without Contrast   REASON FOR EXAM:    Right side weakness; Right facial drooping  COMMENTS:       PROCEDURE: CT  - CT HEAD WITHOUT CONTRAST  - Jul 31 2013  5:58PM     CLINICAL DATA:  Right-sided weakness and facial drooping.    EXAM:  CT HEAD WITHOUT CONTRAST    TECHNIQUE:  Contiguous axial images were obtained from the base of the skull  through the vertex without intravenous contrast.    COMPARISON:  April 13, 2013.  FINDINGS:  Bony calvarium appears intact. Diffuse cortical atrophy is noted.  Chronic ischemicwhite matter disease is  noted. Calcification of the  basal ganglia is noted bilaterally. No mass effect or midline shift  is noted. Ventricular size is within normal limits. There is no  evidence of mass lesion, hemorrhage or acute infarction.     IMPRESSION:  Diffuse cortical atrophy. Chronic ischemic white matter disease. No  acute intracranial abnormality seen.      Electronically Signed    By: Sabino Dick M.D.    On: 07/31/2013 18:00     Verified By: Marveen Reeks, M.D.,   Pertinent Past History:  Pertinent Past History 1. Coronary artery disease, status post bypass graft surgery.  2. Mitral valve disease.  3. History of cerebrovascular accident.  4. Nephrolithiasis.  5. Osteoporosis.  6. Hypertension.  7. Severe dementia.  8. Reactive airway disease.  9. Osteoarthritis.  10. Gastritis.  11. Chronic kidney disease.   Hospital Course:  Hospital Course acute CVA: cont ASA, statin, PT. started  diet per SW study. negative head CT. MRI could not be done due to Cardiac pacemaker.   she was bed bound and total care before this episode happended for last 4-5 mths. Daughter and grand son were taking care of her.  I spoke o her daughter- as there is not much functional activities for last 5-6 mths at this age , she may not benefit from getting PT, and family doesn't want to take her to NH- so discharge home. HTN: cont lopressor. HLP: statin. CAD: ASA, statin, lopressor. Dementia: precaution.   Condition on Discharge Stable   Code Status:  Code Status No Code/Do Not Resuscitate   DISCHARGE INSTRUCTIONS HOME MEDS:  Medication Reconciliation: Patient's Home Medications at Discharge:     Medication Instructions  advair diskus 250 mcg-50 mcg powder  1 puff(s) inhaled 2 times a day   donepezil 10 mg oral tablet  1 tab(s) orally once a day (at bedtime)   aspirin enteric coated 81 mg oral delayed release tablet  1 tab(s) orally once a day   vitamin d2 50,000 intl units oral capsule  1 cap(s) orally  once a month on the 10th of each month   metoprolol tartrate 25 mg oral tablet  1 tab(s) orally 2 times a day   multivitamin  1 tab(s) orally once a day   omeprazole 20 mg delayed release capsule  1 cap(s) orally once a day   levocetirizine 5 mg oral tablet  1 tab(s) orally once a day   calcium 600+d 600 mg-200 units oral tablet  1 tab(s) orally 2 times a day   tylenol 325 mg oral tablet  2 tab(s) orally every 4 hours, As Needed - for Pain   senokot s 50 mg-8.6 mg oral tablet  2 tab(s) orally once a day (at bedtime), As Needed - for Constipation   atorvastatin 20 mg oral tablet  1 tab(s) orally once a day     Physician's Instructions:  Home Health? Yes   [facility]  Nurse  Nurse Aid   Diet Low Sodium  Low Fat, Low Cholesterol   Activity Limitations As tolerated   Return to Work Not Applicable   Time frame for Follow Up Appointment 2-4 weeks   Other Comments routine follow ups with PMD.   Electronic Signatures: Vaughan Basta (MD)  (Signed [phone] 23:25)  Authored: ADMISSION DATE AND DIAGNOSIS, CHIEF COMPLAINT/HPI, Allergies, PERTINENT LABS, PERTINENT RADIOLOGY STUDIES, [place]   Last Updated: 05-May-15 23:25 by Vaughan Basta (MD)

## 2014-07-26 NOTE — H&P (Signed)
PATIENT NAME:  Virginia Adams, Virginia Adams MR#:  [id number] DATE OF BIRTH:  [birth year]  DATE OF ADMISSION:  04/13/2013  ADMITTING PHYSICIAN: Gladstone Lighter, MD  PRIMARY CARE PHYSICIAN: Youlanda Roys. Lovie Macadamia, MD  CHIEF COMPLAINT: Fall.   HISTORY OF PRESENT ILLNESS: Virginia Adams is a 81 year old elderly Caucasian female with past medical history significant for severe dementia, coronary artery disease status post bypass graft surgery, mitral valve disease, history of CVA, hypertension, CKD, reactive airway disease, and also status post pacemaker placement, brought in from [facility] after she had a  Dealer fall. The patient has severe dementia and unable to provide any history. Daughter at bedside gave the history. According to the daughter, they got a call from [facility] this morning stating that patient was in the dining area and slipped out of her wheelchair and fell face forward. She has a bruise and laceration on her forehead that was sutured in the ER. She seems to be at her baseline mental status. which is alert and sometimes repeating the same questions when asked. According to daughter, the patient has been more sleepy over the last month and they are concerned if any chemical restraints are being given to the patient at the nursing home. The patient also exhibited tachyarrhythmia. She already has a pacemaker in place, but her heart rate went as high as in the 160s and has been fluctuating from the 120 to 160 range, so she is being admitted under observation for the same. Also, at the time of discharge, her family wants to take her home with home health if possible and not to get her back to the nursing home.   PAST MEDICAL HISTORY: 1.  Coronary artery disease, status post bypass graft surgery.  2.  Mitral valve disease.  3.  History of CVA.  4.  Nephrolithiasis.  5.  Osteoporosis.  6.  Hypertension.  7.  Severe dementia.  8.  Reactive airway disease.  9.  Osteoarthritis.  10.  Gastritis.  11.   CKD.  PAST SURGICAL HISTORY: 1.  Pacemaker placement.  2.  Coronary artery bypass graft surgery.  3.  Right hip replacement surgery.  4.  Bilateral cataract surgery.   ALLERGIES TO MEDICATIONS: No known drug allergies.   CURRENT HOME MEDICATIONS:  1.  Advair 250/50, 1 puff b.i.d.  2.  Albuterol inhaler 2 puffs q.6 hours p.r.n.  3.  Aricept 10 mg p.o. at bedtime.  4.  Colace 100 mg p.o. twice a day.  5.  MiraLax p.r.n. for constipation.  6.  Vitamin D3, 50,000 international units once a month on the 10th day of each month.  7.  Voltaren gel to the left shoulder as needed q.4 hours p.r.n.  8.  Norco p.r.n. for pain to the left shoulder.  9.  Multivitamin 1 tablet p.o. daily.  10.  Fosamax 70 mg p.o. daily.  11.  Aspirin 81 mg p.o. daily. 12.  Omeprazole 20 mg p.o. in the morning.  13.  Simvastatin 40 mg p.o. daily.  14.  Lasix 20 mg p.o. daily.  15.  Senna Plus twice a day.  16.  Vitamin C 500 mg p.o. daily.  17.  Flonase nasal spray 2 sprays per nostril once a day.   SOCIAL HISTORY: Has been a resident at [facility] for the past 7 to 8 months. No smoking or alcohol use.   FAMILY HISTORY: Diabetes does run in the family.   REVIEW OF SYSTEMS: Unable to be obtained secondary to the patient's  severe dementia.   PHYSICAL EXAMINATION: VITAL SIGNS: Temperature 97.8 degrees Fahrenheit, pulse 120, blood pressure 148/98, respirations 20, pulse oximetry 98% on room air.  GENERAL: A thin-built, ill-nourished female lying in bed, not in any acute distress.  HEENT: Normocephalic, atraumatic. Pupils are 2 mm, postsurgical, minimal reaction to light. Anicteric sclerae. Extraocular movements intact. Oropharynx clear without erythema, mass or exudates. There is a laceration that is sutured on the forehead without any acute signs of bleeding and mild ecchymosis and bruising on the nasal bridge.  NECK: Supple. No thyromegaly, JVD or carotid bruits. No lymphadenopathy.  LUNGS: Moving air  bilaterally. Decreased bibasilar breath sounds. No wheeze or crackles. No use of accessory muscles for breathing.  CARDIOVASCULAR: S1, S2, regular rate and rhythm. Loud IV/VI  systolic murmur heard in the precordial region. No rubs or gallops.  ABDOMEN: Soft, nontender, nondistended. No hepatosplenomegaly. Normal bowel sounds.  EXTREMITIES: No pedal edema. No clubbing or cyanosis. Feeble dorsalis pedis palpable bilaterally.  SKIN: Other than the ecchymosis described on the face, no other rash or lesions.  LYMPHATICS: No cervical lymphadenopathy.  NEUROLOGIC: The patient is alert, following some simple commands, able to move all 4 extremities though weaker in bilateral lower extremities. No focal new motor or sensory changes noted.  PSYCHOLOGICAL: The patient is awake, oriented to self, which is her baseline.   LABORATORY, DIAGNOSTIC AND RADIOLOGICAL DATA: Urinalysis negative for any infection.   Sodium 135, potassium 5.0, chloride 101, bicarbonate 31, BUN 18, creatinine 0.76, glucose 116 and calcium of 9.3.   ALT 25, AST 42, alk phos 62, total bilirubin 0.5 and albumin of 3.1.   WBC 9.3, hemoglobin 14.3, hematocrit 43.1, platelet count 255.   Troponin less than 0.02.   CT head without contrast showing no evidence of acute intracranial injury to head or C-spine. Forehead soft tissue swelling without fracture noted. Atrophy and chronic small ischemic changes noted. Maxillofacial CT showing orbits, globes are unremarkable, no evidence of fracture or dislocation. Forehead soft tissue swelling is present.   EKG showing paced rhythm, heart rate of 103. Left bundle branch block underlying pattern is noted.   ASSESSMENT AND PLAN: This is a 81 year old elderly female from skilled nursing facility with dementia, coronary artery disease, chronic kidney disease, brought in after a mechanical fall and noted to have forehead laceration. CT of the head is normal.  1.  Fall and forehead laceration, status  post sutures. Bleeding has stopped. CT head and cervical spine are negative with no acute fractures or findings. Family does not want the patient to go back to [facility]. Daughter wants to take her home. Care management consult for home health set-up has been ordered. The patient will also need a new PCP and family interested in following with Dr. Hassell Done from [facility], as he was her PCP before going to [facility]. Physical therapy consulted as well.  2.  Dementia: Continue home medications. The patient is on Aricept at this time.  3.  Coronary artery disease, status post bypass graft surgery: Hold aspirin with the fall and bleeding from her laceration. Added metoprolol, and she is already on statin as well.  4.  Tachyarrhythmia with no electrolyte abnormalities, status post pacemaker: Continue to monitor on off-unit telemetry. Added low-dose CR metoprolol at this time.  5.  Chronic obstructive pulmonary disease: Appears to be stable. Continue her inhalers.  6.  Code status: DO NOT RESUSCITATE, as she has a DO NOT RESUSCITATE form signed from the nursing home and also  confirmed with daughter at bedside.   TIME SPENT ON ADMISSION: 50 minutes.    ____________________________ Gladstone Lighter, MD rk:jcm D: 04/13/2013 17:07:32 ET T: 04/13/2013 17:45:55 ET JOB#: [id number]  cc: Gladstone Lighter, MD, <Dictator> Gladstone Lighter MD ELECTRONICALLY SIGNED 04/16/2013 14:20

## 2014-07-26 NOTE — H&P (Signed)
PATIENT NAME:  Virginia Adams, Virginia Adams MR#:  [id number] DATE OF BIRTH:  1919/08/07  DATE OF ADMISSION:  09/09/2013  ADDENDUM: Micah FlesherWent to speak with the family again, another long discussion about her care, about what we are doing for her help.  I evaluated the patient. She had a large bowel movement after she had an enema.  Her blood pressure was still 80s over 40s for a while, but after bowel movement and continuation of IV fluid resuscitation, the patient's blood pressure started to improve.  I communicated to the patient's family the patient still remains unstable but that this is an improvement. She is still delicate and at high risk of cardiac collapse.   TIME SPENT: I spent another 15 minutes with the patient for now.    ____________________________ Felipa Furnaceoberto Sanchez Gutierrez, MD rsg:dd D: 09/09/2013 17:16:39 ET T: 09/09/2013 18:49:51 ET JOB#: [id number]  cc: Felipa Furnaceoberto Sanchez Gutierrez, MD, <Dictator> Baya Lentz Juanda ChanceSANCHEZ GUTIERRE MD ELECTRONICALLY SIGNED 09/25/2013 11:22

## 2014-07-26 NOTE — Discharge Summary (Signed)
PATIENT NAME:  Virginia Adams, Virginia Adams MR#:  [id number] DATE OF BIRTH:  Jul 15, 1919  DATE OF ADMISSION:  09/09/2013 DATE OF DISCHARGE:  09/16/2013  REASON FOR ADMISSION: Abdominal pain and unresponsiveness.   HISTORY OF PRESENT ILLNESS: Please see the dictated HPI done by Dr. Mordecai MaesSanchez on 09/09/2013.   PAST MEDICAL HISTORY: 1.  ASCVD.  2.  Mitral valve prolapse.  3.  Previous stroke.  4.  Osteoporosis.  5.  Nephrolithiasis.  6.  Dementia.  7.  Benign hypertension.  8.  Reactive airways disease.  9.  Osteoarthritis.  10.  Chronic kidney disease.  11.  History of gastritis.  12.  Status post pacemaker implant.  13.  Status post CABG.  14.  Status post right hip surgery.   MEDICATIONS ON ADMISSION: Please see admission note.   ALLERGIES: As per HPI.   REVIEW OF SYSTEMS: As per HPI.   SOCIAL HISTORY, FAMILY HISTORY AND REVIEW OF SYSTEMS: As per HPI.    PHYSICAL EXAMINATION: GENERAL: The patient was chronically ill-appearing, lethargic, not verbally responsive. She was critically ill-appearing.  VITAL SIGNS: Remarkable for a blood pressure of 98/43 which has dropped to 80s over 40s. Temperature 102.2. Respiratory rate 26 to 30. Sats 91% on room air.  HEENT: Unremarkable, except for dry oropharynx.  NECK: Supple without JVD.  LUNGS: Essentially clear. Some tachypnea was noted.  CARDIAC: Regular rate and rhythm. Normal S1, S2.  ABDOMEN: Distended and mildly tender. No rebound.  EXTREMITIES: Without edema.  NEUROLOGIC: Grossly nonfocal.   HOSPITAL COURSE: The patient was admitted with a UTI and presumed sepsis. She actually did have positive blood cultures consistent with urosepsis. She was treated with IV fluids and IV antibiotics. She was a DNR; no code blue, do not resuscitate. She developed pneumonia during this hospitalization. She remained essentially bed ridden. Her mental status did improve slightly. She was seen by palliative care. Discussion took place as  possible discharge to hospice  home, but the family wanted to take her home. She was treated with 6 days of IV antibiotics and switched to oral antibiotics. She was tolerating her oral antibiotics well. By 09/16/2013, the patient was stable and ready for discharge.   DISCHARGE DIAGNOSES: 1.  Sepsis.  2.  Urinary tract infection.  3.  Pneumonia.  4.  Previous stroke.  5.  Chronic dementia.   DISCHARGE MEDICATIONS: 1.  Aspirin 81 mg p.o. daily.  2.  Advair 250/50 one puff b.i.d.  3.  Roxanol 20 mg/mL 0.5 to 1 mL q. 2 hours p.r.n. pain.  4.  Ceftin 250 mg p.o. b.i.d. x10 days.  5.  Lasix 20 mg p.o. daily.  6.  Pepcid 20 mg p.o. daily.  7.  Colace 100 mg p.o. b.i.d.  8.  Protonix 40 mg p.o. daily.  9.  Multivitamin 1 p.o. daily.   FOLLOW-UP PLANS AND APPOINTMENTS: The patient will be followed at home by hospice. She is a DNR. She will follow up with me in 1 to 2 weeks, sooner if needed.   ____________________________ Duane LopeJeffrey D. Judithann SheenSparks, MD jds:sb D: 09/30/2013 16:18:46 ET T: 09/30/2013 16:42:12 ET JOB#: [id number]  cc: Duane LopeJeffrey D. Judithann SheenSparks, MD, <Dictator> JEFFREY Rodena Medin SPARKS MD ELECTRONICALLY SIGNED 10/01/2013 7:50

## 2014-07-26 NOTE — Discharge Summary (Signed)
PATIENT NAME:  Virginia Adams, Renley L MR#:  [id number] DATE OF BIRTH:  01-Nov-1919  DATE OF ADMISSION:  04/13/2013 DATE OF DISCHARGE:  04/14/2013  ADMISSION DIAGNOSES:  1.  Fall and forehead laceration.  2.  Tachyarrhythmia. 3.  Dementia. 4.  History of coronary artery disease status post CABG.  5.  Chronic obstructive pulmonary disease.   DISCHARGE DIAGNOSIS:  1.  Fall and forehead laceration.  2.  Dementia. 3.  Coronary artery disease. 4.  Tachyrhythmia. 5.  Chronic obstructive pulmonary disease.  CONSULTATIONS: Physical therapy.   LABORATORY AND DIAGNOSTICS: Please refer to the CTs as dictated on the H and P.   Discharge laboratories: White blood cells 8.5, hemoglobin 13.5, hematocrit 40, and platelets 231. Sodium 137, potassium 3.8, chloride 104, bicarb 32, BUN 21, creatinine 0.97, glucose 120. Troponins x3 were negative. Urinalysis showed no LCE or nitrites.  HOSPITAL COURSE: This is a very pleasantly demented 81 year old female with a history of CAD status post CABG and pacemaker who presented to the ER after a mechanical fall. For further details, please refer to the H and P. 1.  Fall and forehead laceration. The patient had sutures. Her bleeding has stopped. Her CT of the head and CT of spine with no acute findings. The patient's family did not want to take the patient back to [facility]. They wanted to take her home. She was admitted overnight for observation. Her sutures will need to be taken off in a week. She had physical therapy who did recommend going back to skilled nursing facility, but the family was adamant about taking the patient home. They do have a wheelchair and the grandson will be able to lift the patient and carry her. According to the daughter, the patient has not walked herself in the last 6 months. Case management was consulted for home health.  2.  Dementia. The patient was continued on her outpatient medications.  3.  History of CAD status post CABG. Her aspirin was  initially held, but the bleeding has stopped and she may restart this. She will continue metoprolol.  4.  Tachyrhythmia. No electrolyte abnormalities. No abnormal rhythm noted on telemetry.  5.  COPD. Stable.  DISCHARGE MEDICATIONS: 1.  Multivitamin 1 tablet daily.  2.  Aspirin 81 mg daily.  3.  Advair Diskus 250/50 b.i.d.  4.  Donepezil 10 mg at bedtime.  5.  Diclofenac to left shoulder 4 times a day as needed for osteoarthritis. 6.  Lasix 20 mg daily.  7.  Omeprazole 20 mg daily.  8.  Cholecalciferol 50,000 international units on the 10th of every month.  9.  Metoprolol 25 mg b.i.d., which is a new medication.   DISPOSITION: Discharge home with home health, physical therapy, occupational therapy, nurse and nurse aide.   DISCHARGE DIET: Regular, mechanical soft.   DISCHARGE ACTIVITY: As tolerated.   DISCHARGE INSTRUCTIONS: The patient has seen Dr. Judithann SheenSparks in the past and family is interested in seeing Dr. Judithann SheenSparks again.   The patient will go back to the ER in 6 days to remove sutures.  The patient is medically stable for discharge.   TIME SPENT: Approximately 35 minutes.  ____________________________ Janyth ContesSital P. Juliene PinaMody, MD spm:sb D: 04/14/2013 15:29:27 ET T: 04/15/2013 09:21:58 ET JOB#: [id number]  cc: Tashanda Fuhrer P. Juliene PinaMody, MD, <Dictator> Duane LopeJeffrey D. Judithann SheenSparks, MD Janyth ContesSITAL P Anthonyjames Bargar MD ELECTRONICALLY SIGNED 04/15/2013 14:22

## 2014-07-26 NOTE — H&P (Signed)
PATIENT NAME:  Virginia Adams, Virginia Adams MR#:  [id number] DATE OF BIRTH:  1919/11/13  DATE OF ADMISSION:  09/09/2013  REASON FOR ADMISSION: Abdominal pain and became unresponsive.   PRIMARY CARE PHYSICIAN: Dr. Dorothey Basemanavid Bronstein.   HISTORY OF PRESENT ILLNESS: This is a 81 year old female with history of dementia, bedridden, wheelchair-ridden, with multiple medical problems including coronary artery disease, mitral valve disease, previous CVA, osteoporosis, hypertension, reactive airway disease. Comes today with a history of being her normal self the last couple of days. This morning at 3:00 a.m. in the morning, woke up screaming with abdominal pain, feeling very distended. The patient had a temperature of 102.8. Received some Tylenol by her daughter. She had a couple of episodes where she coughed out and vomited, but it seemed to be sputum which was clear phlegm mostly, small amount. That happened again 3 times, but no more after that. The patient started shaking and had a stool at 3:00 a.m., which was a small amount, pasty. The patient was brought into the Emergency Department, as she was not responsive in the morning.   I had a long discussion with the family. They state that the patient is severely demented. She talks and she recognizes people, but most of the time she is sleepy. She is able to be waked up, but she overall is quiet and prefers not to talk, even whenever she is directly addressed. Hospitalist service were consulted, as the patient has hypotension and has significant sepsis, which appears to come from a urinary tract infection.   REVIEW OF SYSTEMS: Unable to obtain, as the patient is demented and unresponsive.  PAST MEDICAL HISTORY:  1.  CAD.  2.  Mitral valve prolapse.  3.  CVA.  4.  Nephrolithiasis.  5.  Osteoporosis.  6.  Dementia.  7.  Hypertension.  8.  Reactive airway disease.  9.  Osteoarthritis.  10.  Gastritis.  11.  CKD.   PAST SURGICAL HISTORY:  1.  Pacemaker placement.  2.   Coronary artery bypass graft.  3.  Right hip replacement.  4.  Bilateral cataract surgery.   SOCIAL HISTORY: The patient used to be a resident in a nursing home for 7 to 8 months and then last January, she moved out and moved into the daughter's home, and she has 24/7 care right there. Does not smoke. Does not drink. She is bedbound and wheelchair-bound. She is full care.   FAMILY HISTORY: Positive for diabetes. No cancer. No MI.   CURRENT MEDICATIONS: Include aspirin 81 mg daily, Zoloft 100 mg daily, levocetirizine 5 mg daily, atorvastatin 20 mg daily, metoprolol 25 mg twice daily, Advair Diskus 250/50 mcg twice daily, donepezil 10 mg once a day at bedtime, Senokot as needed, omeprazole 20 mg daily, calcium twice daily, multivitamins once daily, vitamin D2 at 50,000 units as needed.   PHYSICAL EXAMINATION: GENERAL: The patient is for the most part very lethargic. She opens her eyes occasionally per commands. The patient is not verbally responsive. The patient is critically ill-looking, pale.  VITAL SIGNS: Her blood pressure was 98/43, then increased with fluids to 110/51, right now continues to drop to 80s/40s. Heart rate is 75. Respirations in between 26 to 30. Temperature 102.2. Oxygen saturation 91% on room air.  HEENT: Her pupils are equal and reactive. Extraocular movements are difficult to assess, as the patient does not follow commands. Anicteric sclerae. Pink conjunctivae. No oral lesions. No oropharyngeal exudates.  NECK: Supple. No JVD. No thyromegaly. No adenopathy. No carotid bruits.  No rigidity.  CARDIOVASCULAR: Regular rate and rhythm. No murmurs, rubs, or gallops.  LUNGS: Overall are clear without any wheezing or crepitus. The patient is tachypneic, but her lungs again are clear. No significant use of accessory muscles at this moment.  ABDOMEN: Distended. Mildly tender to palpation on both flanks. There is distention at the level of the colon. No rebound tenderness, but the patient  is a little bit uncomfortable when deeply palpated. No rebound. No masses or hepatosplenomegaly.  GENITAL: Negative for external lesions.  EXTREMITIES: No edema, cyanosis, or clubbing. Pulses +2. Capillary refill about 5 to 6 seconds.  SKIN: No rashes or petechiae. Very decreased turgor, dry skin.  MUSCULOSKELETAL: No joint effusions or joint swelling.  NEUROLOGIC: The patient is mostly unresponsive. After heavy stimulation, she opens her eyes. The second time that I went to check on her, she was able to look at me and then close her eyes again. Her mucosae are really dry. The patient does not move much of her extremities, but she withdraws to pain.   PSYCHIATRIC: Same, very lethargic.  LYMPHATIC: Negative for lymphadenopathy in neck or supraclavicular areas.   LABORATORY AND RADIOLOGICAL DATA: Her glucose is 152, BUN 19, creatinine 1.6, sodium 140, potassium 3.7. GFR is around 28. Her magnesium is 1.5. Her phosphorus is 2. Her total protein is 6.3, albumin 2.5, AST 43. Troponin is slightly elevated at 0.17. White blood count 25,000, hemoglobin 13, platelet count 255. Her INR is 1.2. Urinalysis: 1500 white blood cells, leukocyte esterase positive. ABG: PH 7.46, pCO2 of 32, pO2 of 58 (respiratory alkalosis due to tachypnea).   Chest x-ray: No acute cardiopulmonary problems, no signs of pneumonia. KUB: Possible impaction.   ASSESSMENT AND PLAN: A 81 year old female with history of multiple medical problems comes with hypotension, unresponsive, critically ill.  1.  Severe sepsis: The patient has criteria for severe sepsis including unresponsiveness, white blood count of 25,000, and a lactic acid above 3. She is also very tachypneic in the 30s. The patient looks critically ill. She is hypotensive with blood pressures 80s/40s. The patient's family do not want any heroics. Right now, we are going to continue IV fluids. We are going to increase the amount of IV fluids that she is getting from 75 to 100. We  are going to continue to monitor closely under telemetry, although the patient is DO NOT RESUSCITATE. The family does not want heroics. She is not a full comfort care, but definitely they want to make sure that if she is about to pass to let her go. The patient is going to receive treatment with IV fluids, and she is going to have antibiotics. Blood cultures taken. Urine culture taken.  2.  Urinary tract infection: The patient is on Levaquin, renally dosed. Follow cultures.  3.  Hypotension: The patient remains hypotensive. This is secondary to severe sepsis. I had long conversations with the family. Actually, there has been some disconnect. The patient's family at the beginning said that they want to avoid any heroics, but after a while they asked me why I am not doing anything for her blood pressure. I explained to them what heroic measures mean and what we are doing for her blood pressure (aggressive IV fluid hydration, etc.), and then after a while after a long conversation, they understand what the point is of DO NOT RESUSCITATE and no heroics, and they want to respect that from their mother's perspective, as their mother is 53 and she has said in the  past whenever she was not demented that she did not want things like that done. I offered them a central line if they wanted one and so far they said, "We're okay with what you are doing."  4.  Abdominal pain: The patient is severely constipated, fecally impacted. We are going to do an enema. If pain not resolved, possibly will do a CT scan.  5.  Coronary artery disease: Continue aspirin as soon as the patient is able to tolerate p.o. as well. 6.  Cerebrovascular accident: The same, treatment with aspirin whenever she can tolerate p.o. or we know that she is more stable. I am not going to do aspirin per rectum right now, as she is severely impacted. 7.  Dementia: Continue to monitor.  8.  Deep vein thrombosis prophylaxis with heparin.  9.  Gastrointestinal  prophylaxis with IV Protonix.   TIME SPENT: I spent about 75 minutes with this family today, critical care time as the patient is severely ill. We are going to work with resuscitation with fluids first, as she is not comfort care. She is actually DNR, but they want me to continue to try measures without crossing the line to heroics. The patient is at high risk of death.    ____________________________ Felipa Furnace, MD rsg:jcm D: 09/09/2013 14:34:11 ET T: 09/09/2013 15:41:19 ET JOB#: [id number]  cc: Felipa Furnace, MD, <Dictator> Christos Mixson Juanda Chance MD ELECTRONICALLY SIGNED 09/25/2013 11:22

## 2015-11-30 IMAGING — CT CT HEAD WITHOUT CONTRAST
1 series · 16 of 30 positions shown, 20 images · non-contrast
Comparison: April 13, 2013.

CLINICAL DATA: Right-sided weakness and facial drooping.

EXAM:
CT HEAD WITHOUT CONTRAST
TECHNIQUE: Contiguous axial images were obtained from the base of the skull
through the vertex without intravenous contrast.

[Series 2: head wo · axial · 0.40mm/px · z∈[+526,+652]mm · 16 of 32 slices shown, 20 images]
[im 2/32  brain]
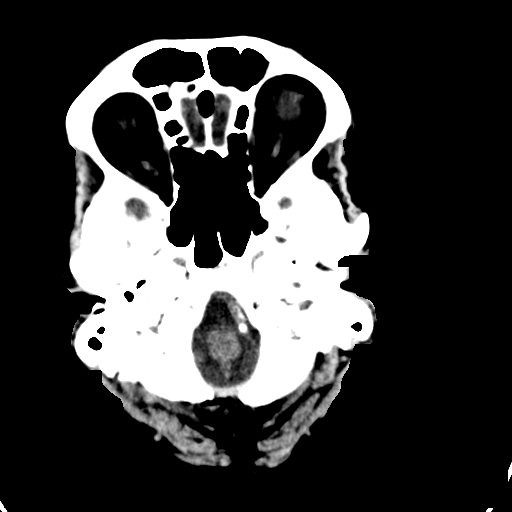
[im 2/32  bone]
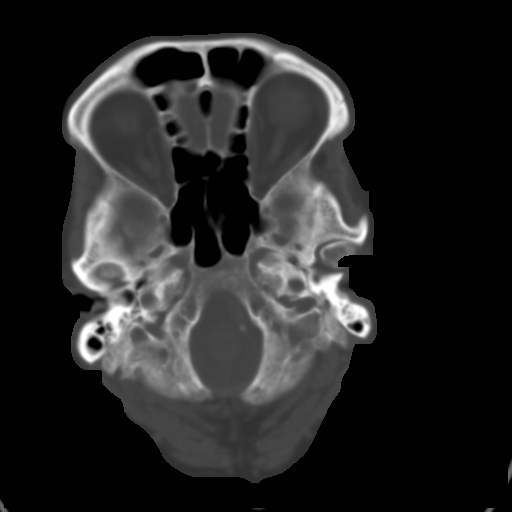
[im 4/32  brain]
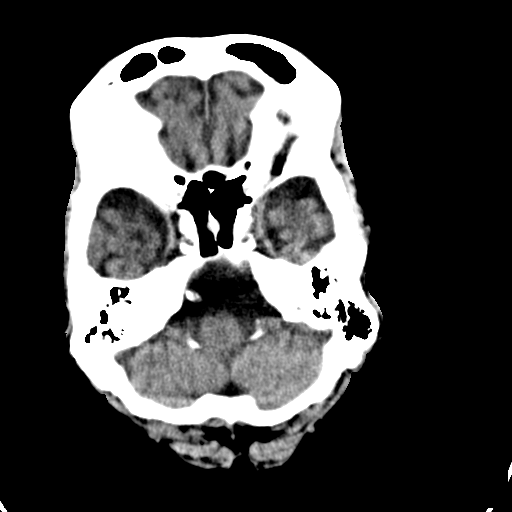
[im 6/32  brain]
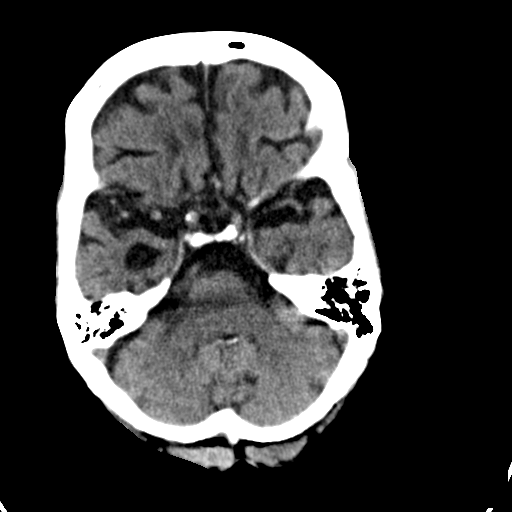
[im 8/32  brain]
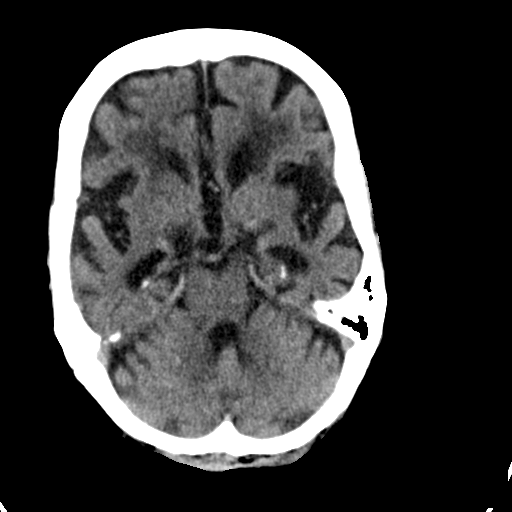
[im 9/32  brain]
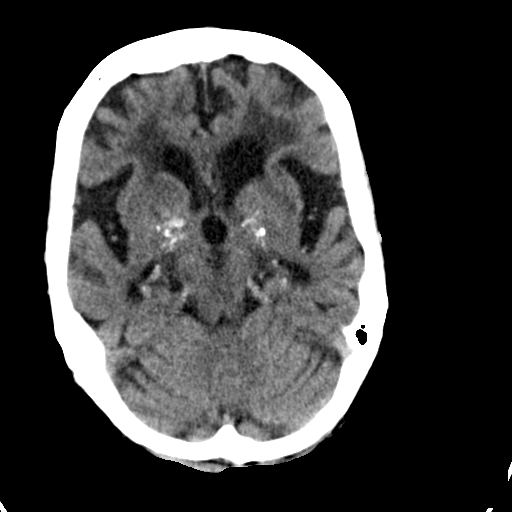
[im 9/32  bone]
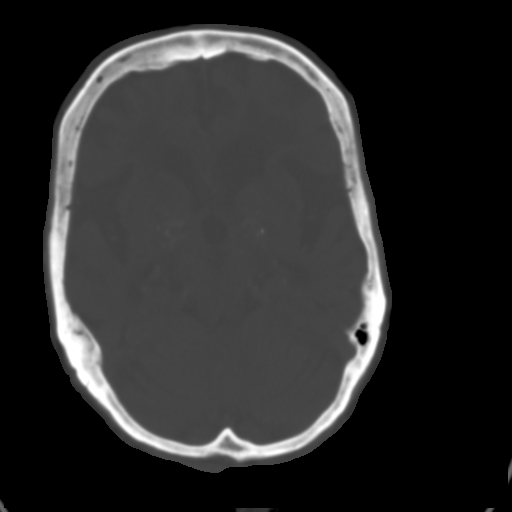
[im 11/32  brain]
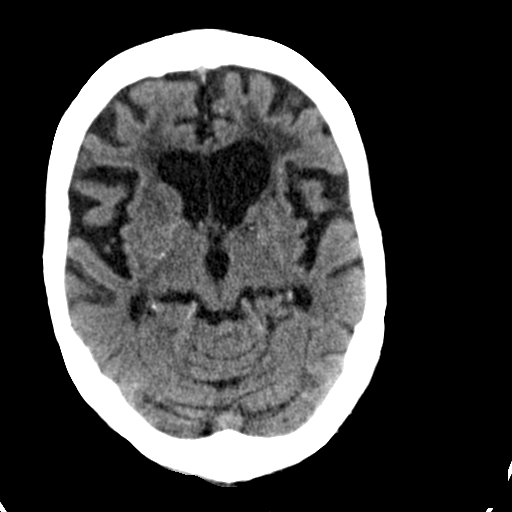
[im 13/32  brain]
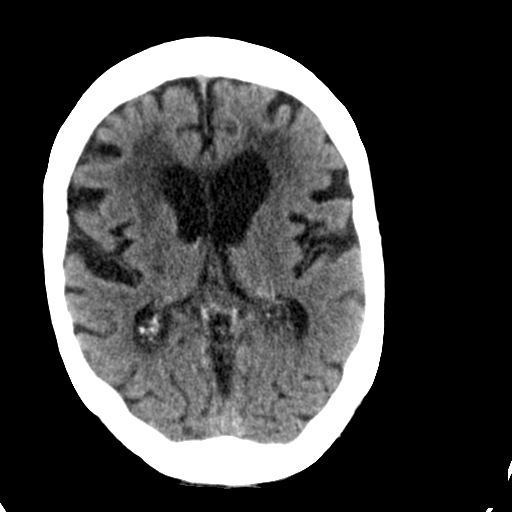
[im 15/32  brain]
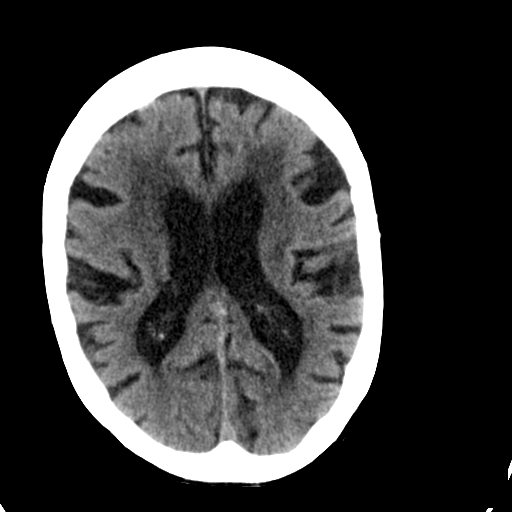
[im 17/32  brain]
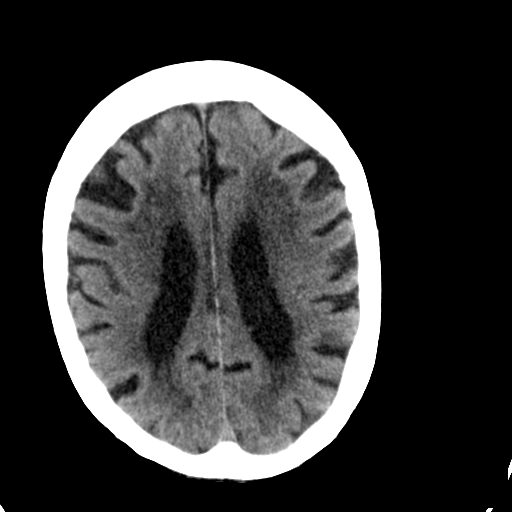
[im 17/32  bone]
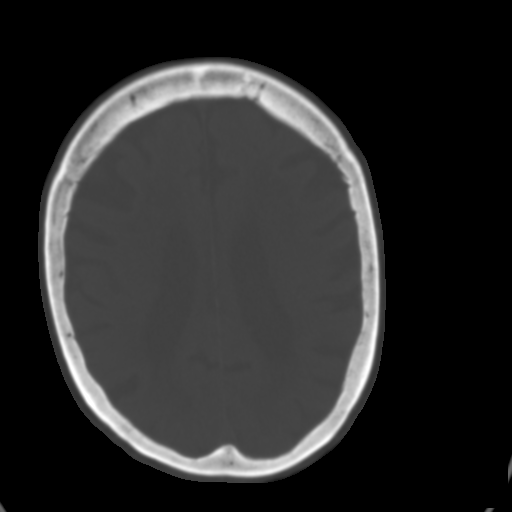
[im 19/32  brain]
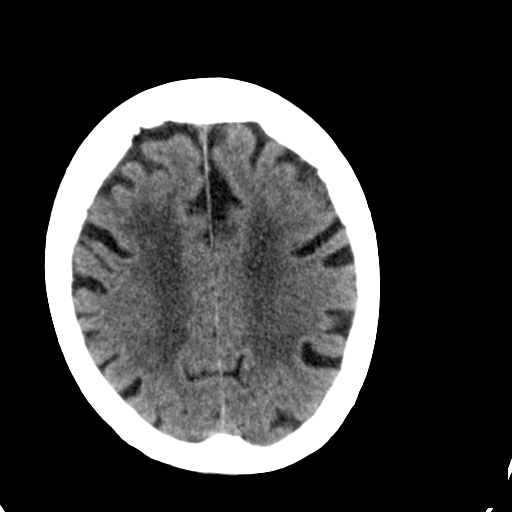
[im 21/32  brain]
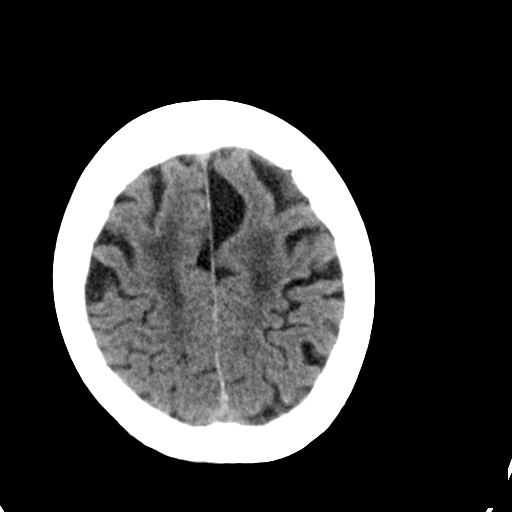
[im 23/32  brain]
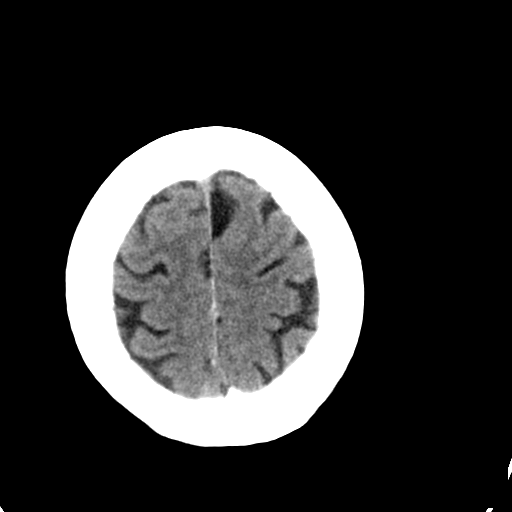
[im 24/32  brain]
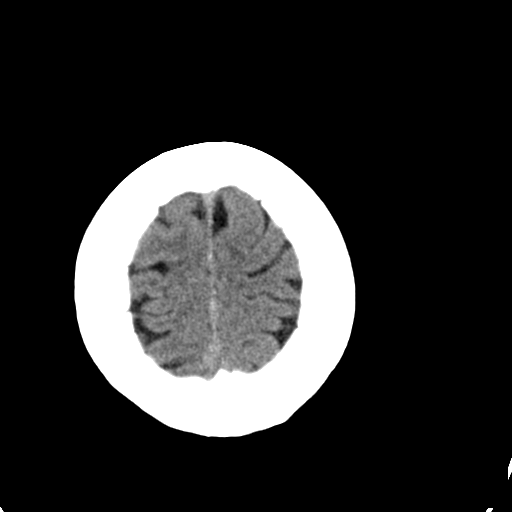
[im 24/32  bone]
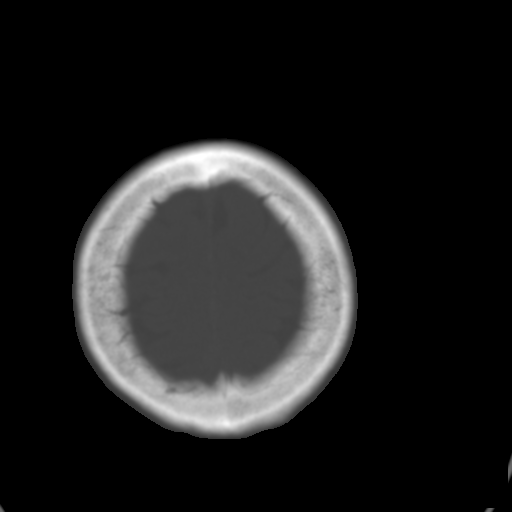
[im 26/32  brain]
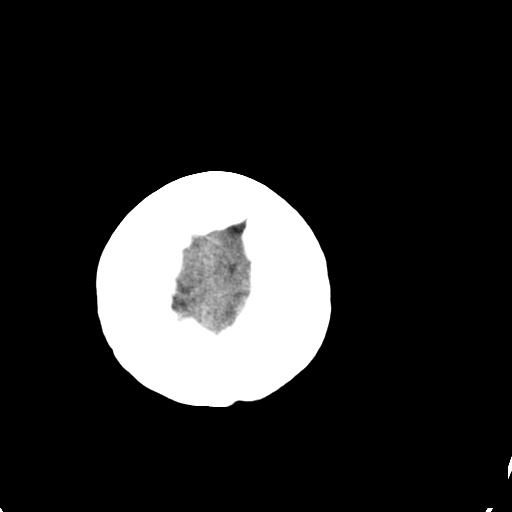
[im 28/32  brain]
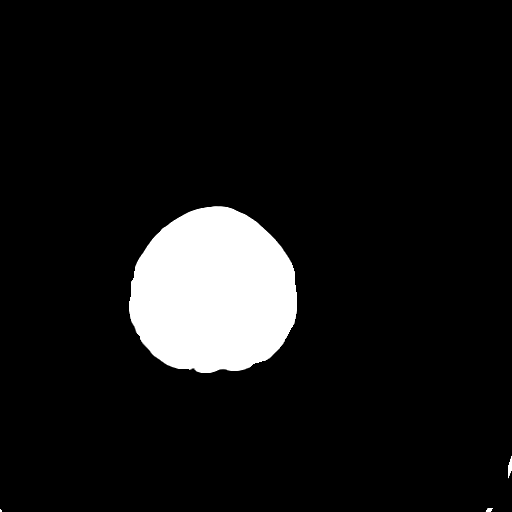
[im 30/32  brain]
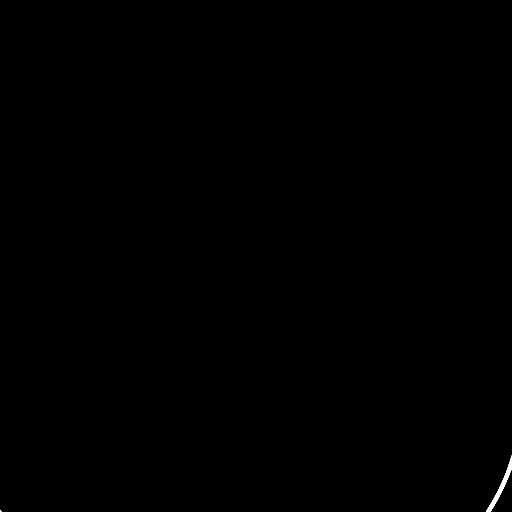

[16 of 30 positions shown; findings below may reference images not displayed]

FINDINGS: Bony calvarium appears intact. Diffuse cortical atrophy is noted.
Chronic ischemic white matter disease is noted. Calcification of the
basal ganglia is noted bilaterally. No mass effect or midline shift
is noted. Ventricular size is within normal limits. There is no
evidence of mass lesion, hemorrhage or acute infarction.
IMPRESSION: Diffuse cortical atrophy. Chronic ischemic white matter disease. No
acute intracranial abnormality seen.
